# Patient Record
Sex: Female | Born: 1980 | Hispanic: No | Marital: Single | Smoking: Never smoker
Health system: Southern US, Community
[De-identification: ages and names within clinical notes are randomized; demographics above are authoritative.]

## PROBLEM LIST (undated history)

## (undated) DIAGNOSIS — F419 Anxiety disorder, unspecified: Secondary | ICD-10-CM

## (undated) DIAGNOSIS — D649 Anemia, unspecified: Secondary | ICD-10-CM

## (undated) DIAGNOSIS — R7303 Prediabetes: Secondary | ICD-10-CM

## (undated) DIAGNOSIS — F32A Depression, unspecified: Secondary | ICD-10-CM

---
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
## 2008-12-01 ENCOUNTER — Other Ambulatory Visit: Admission: RE | Admit: 2008-12-01 | Discharge: 2008-12-01 | Payer: Self-pay | Admitting: Family Medicine

## 2011-05-15 ENCOUNTER — Other Ambulatory Visit (HOSPITAL_COMMUNITY)
Admission: RE | Admit: 2011-05-15 | Discharge: 2011-05-15 | Disposition: A | Discharge status: Home / Self Care | Payer: BC Managed Care – PPO | Source: Ambulatory Visit | Attending: Family Medicine | Admitting: Family Medicine

## 2011-05-15 DIAGNOSIS — Z124 Encounter for screening for malignant neoplasm of cervix: Secondary | ICD-10-CM | POA: Insufficient documentation

## 2015-12-28 DIAGNOSIS — F339 Major depressive disorder, recurrent, unspecified: Secondary | ICD-10-CM | POA: Diagnosis not present

## 2016-01-27 DIAGNOSIS — Z0000 Encounter for general adult medical examination without abnormal findings: Secondary | ICD-10-CM | POA: Diagnosis not present

## 2016-01-27 DIAGNOSIS — E559 Vitamin D deficiency, unspecified: Secondary | ICD-10-CM | POA: Diagnosis not present

## 2016-01-27 DIAGNOSIS — D509 Iron deficiency anemia, unspecified: Secondary | ICD-10-CM | POA: Diagnosis not present

## 2017-05-30 DIAGNOSIS — Z131 Encounter for screening for diabetes mellitus: Secondary | ICD-10-CM | POA: Diagnosis not present

## 2017-05-30 DIAGNOSIS — E559 Vitamin D deficiency, unspecified: Secondary | ICD-10-CM | POA: Diagnosis not present

## 2017-05-30 DIAGNOSIS — F419 Anxiety disorder, unspecified: Secondary | ICD-10-CM | POA: Diagnosis not present

## 2017-05-30 DIAGNOSIS — D509 Iron deficiency anemia, unspecified: Secondary | ICD-10-CM | POA: Diagnosis not present

## 2017-05-30 DIAGNOSIS — Z13220 Encounter for screening for lipoid disorders: Secondary | ICD-10-CM | POA: Diagnosis not present

## 2017-05-30 DIAGNOSIS — F339 Major depressive disorder, recurrent, unspecified: Secondary | ICD-10-CM | POA: Diagnosis not present

## 2017-09-10 DIAGNOSIS — E559 Vitamin D deficiency, unspecified: Secondary | ICD-10-CM | POA: Diagnosis not present

## 2017-09-10 DIAGNOSIS — R7303 Prediabetes: Secondary | ICD-10-CM | POA: Diagnosis not present

## 2017-09-10 DIAGNOSIS — Z0000 Encounter for general adult medical examination without abnormal findings: Secondary | ICD-10-CM | POA: Diagnosis not present

## 2017-09-10 DIAGNOSIS — Z23 Encounter for immunization: Secondary | ICD-10-CM | POA: Diagnosis not present

## 2017-09-10 DIAGNOSIS — D509 Iron deficiency anemia, unspecified: Secondary | ICD-10-CM | POA: Diagnosis not present

## 2017-09-20 ENCOUNTER — Other Ambulatory Visit: Payer: Self-pay | Admitting: Family Medicine

## 2017-09-20 ENCOUNTER — Other Ambulatory Visit (HOSPITAL_COMMUNITY)
Admission: RE | Admit: 2017-09-20 | Discharge: 2017-09-20 | Disposition: A | Discharge status: Home / Self Care | Payer: BLUE CROSS/BLUE SHIELD | Source: Ambulatory Visit | Attending: Family Medicine | Admitting: Family Medicine

## 2017-09-20 DIAGNOSIS — D509 Iron deficiency anemia, unspecified: Secondary | ICD-10-CM | POA: Diagnosis not present

## 2017-09-20 DIAGNOSIS — N898 Other specified noninflammatory disorders of vagina: Secondary | ICD-10-CM | POA: Diagnosis not present

## 2017-09-20 DIAGNOSIS — Z124 Encounter for screening for malignant neoplasm of cervix: Secondary | ICD-10-CM | POA: Diagnosis not present

## 2017-09-20 DIAGNOSIS — E559 Vitamin D deficiency, unspecified: Secondary | ICD-10-CM | POA: Diagnosis not present

## 2017-09-24 LAB — CYTOLOGY - PAP: Diagnosis: NEGATIVE

## 2018-06-13 DIAGNOSIS — L299 Pruritus, unspecified: Secondary | ICD-10-CM | POA: Diagnosis not present

## 2018-09-30 DIAGNOSIS — D509 Iron deficiency anemia, unspecified: Secondary | ICD-10-CM | POA: Diagnosis not present

## 2018-09-30 DIAGNOSIS — E559 Vitamin D deficiency, unspecified: Secondary | ICD-10-CM | POA: Diagnosis not present

## 2018-09-30 DIAGNOSIS — R7303 Prediabetes: Secondary | ICD-10-CM | POA: Diagnosis not present

## 2018-09-30 DIAGNOSIS — S6991XA Unspecified injury of right wrist, hand and finger(s), initial encounter: Secondary | ICD-10-CM | POA: Diagnosis not present

## 2018-09-30 DIAGNOSIS — Z13220 Encounter for screening for lipoid disorders: Secondary | ICD-10-CM | POA: Diagnosis not present

## 2018-09-30 DIAGNOSIS — Z23 Encounter for immunization: Secondary | ICD-10-CM | POA: Diagnosis not present

## 2018-09-30 DIAGNOSIS — Z0000 Encounter for general adult medical examination without abnormal findings: Secondary | ICD-10-CM | POA: Diagnosis not present

## 2018-10-01 ENCOUNTER — Other Ambulatory Visit: Payer: Self-pay | Admitting: Family Medicine

## 2018-10-02 ENCOUNTER — Other Ambulatory Visit: Payer: Self-pay | Admitting: Family Medicine

## 2018-10-02 DIAGNOSIS — N6320 Unspecified lump in the left breast, unspecified quadrant: Secondary | ICD-10-CM

## 2018-10-08 ENCOUNTER — Ambulatory Visit
Admission: RE | Admit: 2018-10-08 | Discharge: 2018-10-08 | Disposition: A | Discharge status: Home / Self Care | Payer: 59 | Source: Ambulatory Visit | Attending: Family Medicine | Admitting: Family Medicine

## 2018-10-08 ENCOUNTER — Other Ambulatory Visit: Payer: Self-pay | Admitting: Family Medicine

## 2018-10-08 ENCOUNTER — Ambulatory Visit
Admission: RE | Admit: 2018-10-08 | Discharge: 2018-10-08 | Disposition: A | Discharge status: Home / Self Care | Payer: BLUE CROSS/BLUE SHIELD | Source: Ambulatory Visit | Attending: Family Medicine | Admitting: Family Medicine

## 2018-10-08 ENCOUNTER — Other Ambulatory Visit: Payer: Self-pay

## 2018-10-08 DIAGNOSIS — N63 Unspecified lump in unspecified breast: Secondary | ICD-10-CM

## 2018-10-08 DIAGNOSIS — N6489 Other specified disorders of breast: Secondary | ICD-10-CM | POA: Diagnosis not present

## 2018-10-08 DIAGNOSIS — R928 Other abnormal and inconclusive findings on diagnostic imaging of breast: Secondary | ICD-10-CM | POA: Diagnosis not present

## 2018-10-30 DIAGNOSIS — Z862 Personal history of diseases of the blood and blood-forming organs and certain disorders involving the immune mechanism: Secondary | ICD-10-CM | POA: Diagnosis not present

## 2019-03-03 DIAGNOSIS — H6980 Other specified disorders of Eustachian tube, unspecified ear: Secondary | ICD-10-CM | POA: Diagnosis not present

## 2019-07-25 DIAGNOSIS — Z20828 Contact with and (suspected) exposure to other viral communicable diseases: Secondary | ICD-10-CM | POA: Diagnosis not present

## 2019-09-09 DIAGNOSIS — M5412 Radiculopathy, cervical region: Secondary | ICD-10-CM | POA: Diagnosis not present

## 2019-09-09 DIAGNOSIS — M50323 Other cervical disc degeneration at C6-C7 level: Secondary | ICD-10-CM | POA: Diagnosis not present

## 2019-09-09 DIAGNOSIS — M546 Pain in thoracic spine: Secondary | ICD-10-CM | POA: Diagnosis not present

## 2019-09-09 DIAGNOSIS — M4124 Other idiopathic scoliosis, thoracic region: Secondary | ICD-10-CM | POA: Diagnosis not present

## 2019-09-16 DIAGNOSIS — M50323 Other cervical disc degeneration at C6-C7 level: Secondary | ICD-10-CM | POA: Diagnosis not present

## 2019-09-16 DIAGNOSIS — M546 Pain in thoracic spine: Secondary | ICD-10-CM | POA: Diagnosis not present

## 2019-09-16 DIAGNOSIS — M4124 Other idiopathic scoliosis, thoracic region: Secondary | ICD-10-CM | POA: Diagnosis not present

## 2019-09-16 DIAGNOSIS — M5412 Radiculopathy, cervical region: Secondary | ICD-10-CM | POA: Diagnosis not present

## 2019-09-17 DIAGNOSIS — M4124 Other idiopathic scoliosis, thoracic region: Secondary | ICD-10-CM | POA: Diagnosis not present

## 2019-09-17 DIAGNOSIS — M50323 Other cervical disc degeneration at C6-C7 level: Secondary | ICD-10-CM | POA: Diagnosis not present

## 2019-09-17 DIAGNOSIS — M5412 Radiculopathy, cervical region: Secondary | ICD-10-CM | POA: Diagnosis not present

## 2019-09-17 DIAGNOSIS — M546 Pain in thoracic spine: Secondary | ICD-10-CM | POA: Diagnosis not present

## 2019-09-21 DIAGNOSIS — M5412 Radiculopathy, cervical region: Secondary | ICD-10-CM | POA: Diagnosis not present

## 2019-09-21 DIAGNOSIS — M546 Pain in thoracic spine: Secondary | ICD-10-CM | POA: Diagnosis not present

## 2019-09-21 DIAGNOSIS — M4124 Other idiopathic scoliosis, thoracic region: Secondary | ICD-10-CM | POA: Diagnosis not present

## 2019-09-21 DIAGNOSIS — M50323 Other cervical disc degeneration at C6-C7 level: Secondary | ICD-10-CM | POA: Diagnosis not present

## 2019-09-22 DIAGNOSIS — M50323 Other cervical disc degeneration at C6-C7 level: Secondary | ICD-10-CM | POA: Diagnosis not present

## 2019-09-22 DIAGNOSIS — M5412 Radiculopathy, cervical region: Secondary | ICD-10-CM | POA: Diagnosis not present

## 2019-09-22 DIAGNOSIS — M4124 Other idiopathic scoliosis, thoracic region: Secondary | ICD-10-CM | POA: Diagnosis not present

## 2019-09-22 DIAGNOSIS — M546 Pain in thoracic spine: Secondary | ICD-10-CM | POA: Diagnosis not present

## 2019-09-24 DIAGNOSIS — M5412 Radiculopathy, cervical region: Secondary | ICD-10-CM | POA: Diagnosis not present

## 2019-09-24 DIAGNOSIS — M50323 Other cervical disc degeneration at C6-C7 level: Secondary | ICD-10-CM | POA: Diagnosis not present

## 2019-09-24 DIAGNOSIS — M546 Pain in thoracic spine: Secondary | ICD-10-CM | POA: Diagnosis not present

## 2019-09-24 DIAGNOSIS — M4124 Other idiopathic scoliosis, thoracic region: Secondary | ICD-10-CM | POA: Diagnosis not present

## 2019-09-28 DIAGNOSIS — M5412 Radiculopathy, cervical region: Secondary | ICD-10-CM | POA: Diagnosis not present

## 2019-09-28 DIAGNOSIS — M50323 Other cervical disc degeneration at C6-C7 level: Secondary | ICD-10-CM | POA: Diagnosis not present

## 2019-09-28 DIAGNOSIS — M546 Pain in thoracic spine: Secondary | ICD-10-CM | POA: Diagnosis not present

## 2019-09-28 DIAGNOSIS — M4124 Other idiopathic scoliosis, thoracic region: Secondary | ICD-10-CM | POA: Diagnosis not present

## 2019-09-30 DIAGNOSIS — M5412 Radiculopathy, cervical region: Secondary | ICD-10-CM | POA: Diagnosis not present

## 2019-09-30 DIAGNOSIS — M4124 Other idiopathic scoliosis, thoracic region: Secondary | ICD-10-CM | POA: Diagnosis not present

## 2019-09-30 DIAGNOSIS — M546 Pain in thoracic spine: Secondary | ICD-10-CM | POA: Diagnosis not present

## 2019-09-30 DIAGNOSIS — M50323 Other cervical disc degeneration at C6-C7 level: Secondary | ICD-10-CM | POA: Diagnosis not present

## 2019-10-01 DIAGNOSIS — M5412 Radiculopathy, cervical region: Secondary | ICD-10-CM | POA: Diagnosis not present

## 2019-10-01 DIAGNOSIS — M50323 Other cervical disc degeneration at C6-C7 level: Secondary | ICD-10-CM | POA: Diagnosis not present

## 2019-10-01 DIAGNOSIS — M4124 Other idiopathic scoliosis, thoracic region: Secondary | ICD-10-CM | POA: Diagnosis not present

## 2019-10-01 DIAGNOSIS — M546 Pain in thoracic spine: Secondary | ICD-10-CM | POA: Diagnosis not present

## 2019-10-06 DIAGNOSIS — M546 Pain in thoracic spine: Secondary | ICD-10-CM | POA: Diagnosis not present

## 2019-10-06 DIAGNOSIS — M50323 Other cervical disc degeneration at C6-C7 level: Secondary | ICD-10-CM | POA: Diagnosis not present

## 2019-10-06 DIAGNOSIS — M5412 Radiculopathy, cervical region: Secondary | ICD-10-CM | POA: Diagnosis not present

## 2019-10-06 DIAGNOSIS — M4124 Other idiopathic scoliosis, thoracic region: Secondary | ICD-10-CM | POA: Diagnosis not present

## 2019-10-07 DIAGNOSIS — M546 Pain in thoracic spine: Secondary | ICD-10-CM | POA: Diagnosis not present

## 2019-10-07 DIAGNOSIS — M4124 Other idiopathic scoliosis, thoracic region: Secondary | ICD-10-CM | POA: Diagnosis not present

## 2019-10-07 DIAGNOSIS — M5412 Radiculopathy, cervical region: Secondary | ICD-10-CM | POA: Diagnosis not present

## 2019-10-07 DIAGNOSIS — M50323 Other cervical disc degeneration at C6-C7 level: Secondary | ICD-10-CM | POA: Diagnosis not present

## 2019-10-12 DIAGNOSIS — M542 Cervicalgia: Secondary | ICD-10-CM | POA: Diagnosis not present

## 2019-10-12 DIAGNOSIS — M5412 Radiculopathy, cervical region: Secondary | ICD-10-CM | POA: Diagnosis not present

## 2019-10-12 DIAGNOSIS — Z6826 Body mass index (BMI) 26.0-26.9, adult: Secondary | ICD-10-CM | POA: Diagnosis not present

## 2019-10-26 DIAGNOSIS — M5412 Radiculopathy, cervical region: Secondary | ICD-10-CM | POA: Diagnosis not present

## 2019-10-26 DIAGNOSIS — M542 Cervicalgia: Secondary | ICD-10-CM | POA: Diagnosis not present

## 2019-10-30 DIAGNOSIS — M6281 Muscle weakness (generalized): Secondary | ICD-10-CM | POA: Diagnosis not present

## 2019-10-30 DIAGNOSIS — M5412 Radiculopathy, cervical region: Secondary | ICD-10-CM | POA: Diagnosis not present

## 2019-10-30 DIAGNOSIS — M546 Pain in thoracic spine: Secondary | ICD-10-CM | POA: Diagnosis not present

## 2019-10-30 DIAGNOSIS — M542 Cervicalgia: Secondary | ICD-10-CM | POA: Diagnosis not present

## 2019-11-03 DIAGNOSIS — M5412 Radiculopathy, cervical region: Secondary | ICD-10-CM | POA: Diagnosis not present

## 2019-11-03 DIAGNOSIS — M546 Pain in thoracic spine: Secondary | ICD-10-CM | POA: Diagnosis not present

## 2019-11-03 DIAGNOSIS — M6281 Muscle weakness (generalized): Secondary | ICD-10-CM | POA: Diagnosis not present

## 2019-11-03 DIAGNOSIS — M542 Cervicalgia: Secondary | ICD-10-CM | POA: Diagnosis not present

## 2019-11-05 DIAGNOSIS — M546 Pain in thoracic spine: Secondary | ICD-10-CM | POA: Diagnosis not present

## 2019-11-05 DIAGNOSIS — M542 Cervicalgia: Secondary | ICD-10-CM | POA: Diagnosis not present

## 2019-11-05 DIAGNOSIS — M5412 Radiculopathy, cervical region: Secondary | ICD-10-CM | POA: Diagnosis not present

## 2019-11-05 DIAGNOSIS — M6281 Muscle weakness (generalized): Secondary | ICD-10-CM | POA: Diagnosis not present

## 2019-11-10 DIAGNOSIS — M546 Pain in thoracic spine: Secondary | ICD-10-CM | POA: Diagnosis not present

## 2019-11-10 DIAGNOSIS — M542 Cervicalgia: Secondary | ICD-10-CM | POA: Diagnosis not present

## 2019-11-10 DIAGNOSIS — M5412 Radiculopathy, cervical region: Secondary | ICD-10-CM | POA: Diagnosis not present

## 2019-11-10 DIAGNOSIS — M6281 Muscle weakness (generalized): Secondary | ICD-10-CM | POA: Diagnosis not present

## 2019-11-12 DIAGNOSIS — M546 Pain in thoracic spine: Secondary | ICD-10-CM | POA: Diagnosis not present

## 2019-11-12 DIAGNOSIS — M6281 Muscle weakness (generalized): Secondary | ICD-10-CM | POA: Diagnosis not present

## 2019-11-12 DIAGNOSIS — M5412 Radiculopathy, cervical region: Secondary | ICD-10-CM | POA: Diagnosis not present

## 2019-11-12 DIAGNOSIS — M542 Cervicalgia: Secondary | ICD-10-CM | POA: Diagnosis not present

## 2019-11-17 DIAGNOSIS — M542 Cervicalgia: Secondary | ICD-10-CM | POA: Diagnosis not present

## 2019-11-17 DIAGNOSIS — M5412 Radiculopathy, cervical region: Secondary | ICD-10-CM | POA: Diagnosis not present

## 2019-11-17 DIAGNOSIS — M6281 Muscle weakness (generalized): Secondary | ICD-10-CM | POA: Diagnosis not present

## 2019-11-17 DIAGNOSIS — M546 Pain in thoracic spine: Secondary | ICD-10-CM | POA: Diagnosis not present

## 2019-11-19 DIAGNOSIS — M5412 Radiculopathy, cervical region: Secondary | ICD-10-CM | POA: Diagnosis not present

## 2019-11-19 DIAGNOSIS — M546 Pain in thoracic spine: Secondary | ICD-10-CM | POA: Diagnosis not present

## 2019-11-19 DIAGNOSIS — M542 Cervicalgia: Secondary | ICD-10-CM | POA: Diagnosis not present

## 2019-11-19 DIAGNOSIS — M6281 Muscle weakness (generalized): Secondary | ICD-10-CM | POA: Diagnosis not present

## 2019-11-24 DIAGNOSIS — M546 Pain in thoracic spine: Secondary | ICD-10-CM | POA: Diagnosis not present

## 2019-11-24 DIAGNOSIS — M5412 Radiculopathy, cervical region: Secondary | ICD-10-CM | POA: Diagnosis not present

## 2019-11-24 DIAGNOSIS — M542 Cervicalgia: Secondary | ICD-10-CM | POA: Diagnosis not present

## 2019-11-24 DIAGNOSIS — M6281 Muscle weakness (generalized): Secondary | ICD-10-CM | POA: Diagnosis not present

## 2019-11-26 DIAGNOSIS — M5412 Radiculopathy, cervical region: Secondary | ICD-10-CM | POA: Diagnosis not present

## 2019-11-26 DIAGNOSIS — M6281 Muscle weakness (generalized): Secondary | ICD-10-CM | POA: Diagnosis not present

## 2019-11-26 DIAGNOSIS — M546 Pain in thoracic spine: Secondary | ICD-10-CM | POA: Diagnosis not present

## 2019-11-26 DIAGNOSIS — M542 Cervicalgia: Secondary | ICD-10-CM | POA: Diagnosis not present

## 2019-12-01 DIAGNOSIS — M546 Pain in thoracic spine: Secondary | ICD-10-CM | POA: Diagnosis not present

## 2019-12-01 DIAGNOSIS — M6281 Muscle weakness (generalized): Secondary | ICD-10-CM | POA: Diagnosis not present

## 2019-12-01 DIAGNOSIS — M5412 Radiculopathy, cervical region: Secondary | ICD-10-CM | POA: Diagnosis not present

## 2019-12-01 DIAGNOSIS — M542 Cervicalgia: Secondary | ICD-10-CM | POA: Diagnosis not present

## 2019-12-03 DIAGNOSIS — M542 Cervicalgia: Secondary | ICD-10-CM | POA: Diagnosis not present

## 2019-12-03 DIAGNOSIS — M6281 Muscle weakness (generalized): Secondary | ICD-10-CM | POA: Diagnosis not present

## 2019-12-03 DIAGNOSIS — Z23 Encounter for immunization: Secondary | ICD-10-CM | POA: Diagnosis not present

## 2019-12-03 DIAGNOSIS — M5412 Radiculopathy, cervical region: Secondary | ICD-10-CM | POA: Diagnosis not present

## 2019-12-03 DIAGNOSIS — M546 Pain in thoracic spine: Secondary | ICD-10-CM | POA: Diagnosis not present

## 2019-12-08 DIAGNOSIS — M5412 Radiculopathy, cervical region: Secondary | ICD-10-CM | POA: Diagnosis not present

## 2019-12-08 DIAGNOSIS — M6281 Muscle weakness (generalized): Secondary | ICD-10-CM | POA: Diagnosis not present

## 2019-12-08 DIAGNOSIS — M546 Pain in thoracic spine: Secondary | ICD-10-CM | POA: Diagnosis not present

## 2019-12-08 DIAGNOSIS — M542 Cervicalgia: Secondary | ICD-10-CM | POA: Diagnosis not present

## 2019-12-08 DIAGNOSIS — Z6826 Body mass index (BMI) 26.0-26.9, adult: Secondary | ICD-10-CM | POA: Diagnosis not present

## 2019-12-10 DIAGNOSIS — M546 Pain in thoracic spine: Secondary | ICD-10-CM | POA: Diagnosis not present

## 2019-12-10 DIAGNOSIS — M6281 Muscle weakness (generalized): Secondary | ICD-10-CM | POA: Diagnosis not present

## 2019-12-10 DIAGNOSIS — M5412 Radiculopathy, cervical region: Secondary | ICD-10-CM | POA: Diagnosis not present

## 2019-12-10 DIAGNOSIS — M542 Cervicalgia: Secondary | ICD-10-CM | POA: Diagnosis not present

## 2019-12-15 DIAGNOSIS — M6281 Muscle weakness (generalized): Secondary | ICD-10-CM | POA: Diagnosis not present

## 2019-12-15 DIAGNOSIS — M546 Pain in thoracic spine: Secondary | ICD-10-CM | POA: Diagnosis not present

## 2019-12-15 DIAGNOSIS — M5412 Radiculopathy, cervical region: Secondary | ICD-10-CM | POA: Diagnosis not present

## 2019-12-15 DIAGNOSIS — M542 Cervicalgia: Secondary | ICD-10-CM | POA: Diagnosis not present

## 2019-12-18 DIAGNOSIS — M546 Pain in thoracic spine: Secondary | ICD-10-CM | POA: Diagnosis not present

## 2019-12-18 DIAGNOSIS — M6281 Muscle weakness (generalized): Secondary | ICD-10-CM | POA: Diagnosis not present

## 2019-12-18 DIAGNOSIS — M5412 Radiculopathy, cervical region: Secondary | ICD-10-CM | POA: Diagnosis not present

## 2019-12-18 DIAGNOSIS — M542 Cervicalgia: Secondary | ICD-10-CM | POA: Diagnosis not present

## 2019-12-24 DIAGNOSIS — M542 Cervicalgia: Secondary | ICD-10-CM | POA: Diagnosis not present

## 2019-12-24 DIAGNOSIS — M546 Pain in thoracic spine: Secondary | ICD-10-CM | POA: Diagnosis not present

## 2019-12-24 DIAGNOSIS — M5412 Radiculopathy, cervical region: Secondary | ICD-10-CM | POA: Diagnosis not present

## 2019-12-24 DIAGNOSIS — M6281 Muscle weakness (generalized): Secondary | ICD-10-CM | POA: Diagnosis not present

## 2019-12-25 DIAGNOSIS — Z23 Encounter for immunization: Secondary | ICD-10-CM | POA: Diagnosis not present

## 2019-12-29 DIAGNOSIS — M5412 Radiculopathy, cervical region: Secondary | ICD-10-CM | POA: Diagnosis not present

## 2019-12-29 DIAGNOSIS — M546 Pain in thoracic spine: Secondary | ICD-10-CM | POA: Diagnosis not present

## 2019-12-29 DIAGNOSIS — M542 Cervicalgia: Secondary | ICD-10-CM | POA: Diagnosis not present

## 2019-12-29 DIAGNOSIS — M6281 Muscle weakness (generalized): Secondary | ICD-10-CM | POA: Diagnosis not present

## 2020-01-12 DIAGNOSIS — M6281 Muscle weakness (generalized): Secondary | ICD-10-CM | POA: Diagnosis not present

## 2020-01-12 DIAGNOSIS — M542 Cervicalgia: Secondary | ICD-10-CM | POA: Diagnosis not present

## 2020-01-12 DIAGNOSIS — M546 Pain in thoracic spine: Secondary | ICD-10-CM | POA: Diagnosis not present

## 2020-01-12 DIAGNOSIS — M5412 Radiculopathy, cervical region: Secondary | ICD-10-CM | POA: Diagnosis not present

## 2020-01-26 DIAGNOSIS — M6281 Muscle weakness (generalized): Secondary | ICD-10-CM | POA: Diagnosis not present

## 2020-01-26 DIAGNOSIS — M5412 Radiculopathy, cervical region: Secondary | ICD-10-CM | POA: Diagnosis not present

## 2020-01-26 DIAGNOSIS — M546 Pain in thoracic spine: Secondary | ICD-10-CM | POA: Diagnosis not present

## 2020-01-26 DIAGNOSIS — M542 Cervicalgia: Secondary | ICD-10-CM | POA: Diagnosis not present

## 2020-02-16 DIAGNOSIS — M6281 Muscle weakness (generalized): Secondary | ICD-10-CM | POA: Diagnosis not present

## 2020-02-16 DIAGNOSIS — M5412 Radiculopathy, cervical region: Secondary | ICD-10-CM | POA: Diagnosis not present

## 2020-02-16 DIAGNOSIS — M546 Pain in thoracic spine: Secondary | ICD-10-CM | POA: Diagnosis not present

## 2020-02-16 DIAGNOSIS — M542 Cervicalgia: Secondary | ICD-10-CM | POA: Diagnosis not present

## 2020-03-08 DIAGNOSIS — M5412 Radiculopathy, cervical region: Secondary | ICD-10-CM | POA: Diagnosis not present

## 2020-03-08 DIAGNOSIS — Z6826 Body mass index (BMI) 26.0-26.9, adult: Secondary | ICD-10-CM | POA: Diagnosis not present

## 2020-03-08 DIAGNOSIS — M542 Cervicalgia: Secondary | ICD-10-CM | POA: Diagnosis not present

## 2020-04-27 ENCOUNTER — Other Ambulatory Visit: Payer: Self-pay | Admitting: Radiology

## 2020-04-27 ENCOUNTER — Other Ambulatory Visit: Payer: Self-pay

## 2020-04-27 DIAGNOSIS — Z20822 Contact with and (suspected) exposure to covid-19: Secondary | ICD-10-CM

## 2020-04-28 LAB — NOVEL CORONAVIRUS, NAA: SARS-CoV-2, NAA: NOT DETECTED

## 2020-04-28 LAB — SARS-COV-2, NAA 2 DAY TAT

## 2020-05-17 DIAGNOSIS — Z20822 Contact with and (suspected) exposure to covid-19: Secondary | ICD-10-CM | POA: Diagnosis not present

## 2020-05-31 DIAGNOSIS — Z20822 Contact with and (suspected) exposure to covid-19: Secondary | ICD-10-CM | POA: Diagnosis not present

## 2020-08-29 DIAGNOSIS — Z20822 Contact with and (suspected) exposure to covid-19: Secondary | ICD-10-CM | POA: Diagnosis not present

## 2020-11-14 DIAGNOSIS — D509 Iron deficiency anemia, unspecified: Secondary | ICD-10-CM | POA: Diagnosis not present

## 2020-11-14 DIAGNOSIS — R7303 Prediabetes: Secondary | ICD-10-CM | POA: Diagnosis not present

## 2020-11-14 DIAGNOSIS — Z0000 Encounter for general adult medical examination without abnormal findings: Secondary | ICD-10-CM | POA: Diagnosis not present

## 2020-11-14 DIAGNOSIS — F3341 Major depressive disorder, recurrent, in partial remission: Secondary | ICD-10-CM | POA: Diagnosis not present

## 2020-11-14 DIAGNOSIS — E559 Vitamin D deficiency, unspecified: Secondary | ICD-10-CM | POA: Diagnosis not present

## 2020-11-15 DIAGNOSIS — M5412 Radiculopathy, cervical region: Secondary | ICD-10-CM | POA: Diagnosis not present

## 2020-11-15 DIAGNOSIS — Z6826 Body mass index (BMI) 26.0-26.9, adult: Secondary | ICD-10-CM | POA: Diagnosis not present

## 2020-11-15 DIAGNOSIS — M542 Cervicalgia: Secondary | ICD-10-CM | POA: Diagnosis not present

## 2020-12-26 DIAGNOSIS — Z124 Encounter for screening for malignant neoplasm of cervix: Secondary | ICD-10-CM | POA: Diagnosis not present

## 2020-12-26 DIAGNOSIS — F3341 Major depressive disorder, recurrent, in partial remission: Secondary | ICD-10-CM | POA: Diagnosis not present

## 2021-05-17 DIAGNOSIS — M542 Cervicalgia: Secondary | ICD-10-CM | POA: Diagnosis not present

## 2021-05-17 DIAGNOSIS — M5412 Radiculopathy, cervical region: Secondary | ICD-10-CM | POA: Diagnosis not present

## 2021-09-26 ENCOUNTER — Encounter: Payer: Self-pay | Admitting: Orthopaedic Surgery

## 2021-09-26 ENCOUNTER — Ambulatory Visit (INDEPENDENT_AMBULATORY_CARE_PROVIDER_SITE_OTHER): Payer: 59 | Admitting: Orthopaedic Surgery

## 2021-09-26 ENCOUNTER — Ambulatory Visit (INDEPENDENT_AMBULATORY_CARE_PROVIDER_SITE_OTHER): Payer: 59

## 2021-09-26 ENCOUNTER — Other Ambulatory Visit: Payer: Self-pay

## 2021-09-26 VITALS — BP 101/69 | Ht 66.0 in | Wt 155.0 lb

## 2021-09-26 DIAGNOSIS — M542 Cervicalgia: Secondary | ICD-10-CM

## 2021-09-26 DIAGNOSIS — G8929 Other chronic pain: Secondary | ICD-10-CM

## 2021-09-26 DIAGNOSIS — M25511 Pain in right shoulder: Secondary | ICD-10-CM

## 2021-09-26 NOTE — Progress Notes (Signed)
Office Visit Note   Patient: Cheryl Juarez           Date of Birth: Jun 06, 1981           MRN: [id number] Visit Date: 09/26/2021              Requested by: No referring provider defined for this encounter. PCP: No primary care provider on file.   Assessment & Plan: Visit Diagnoses:  1. Neck pain   2. Chronic right shoulder pain   3. Cervicalgia     Plan: Patient significant symptoms for greater than 2 years not responsive to gabapentin chiropractic treatments anti-inflammatories.  She has been treated at [facility] for greater than 6 months.  Would recommend proceeding with cervical MRI scan for persistent neck and right arm radicular symptoms C6 distribution.  Follow-Up Instructions: No follow-ups on file.   Orders:  Orders Placed This Encounter  Procedures   XR Cervical Spine 2 or 3 views   XR Shoulder Right   MR Cervical Spine w/o contrast   No orders of the defined types were placed in this encounter.     Procedures: No procedures performed   Clinical Data: No additional findings.   Subjective: Chief Complaint  Patient presents with   Neck - Pain   Right Shoulder - Pain    HPI 41 year old female with neck pain right shoulder pain and pain between her shoulder blades x2 years.  She states she picked up a bookshelf fell within the buttocks about 2 years ago and has had some persistent symptoms since then.  She has been seen by chiropractor.  She has seen a spine scoliosis and has been on gabapentin which she states is increased the dose just makes her sleepy.  Pain radiates down her arm down into her hand primarily index finger.  She denies headaches.  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  No symptoms on the left upper extremity.  She is right-hand dominant.  She types.  Patient concerned about long-term Neurontin with the other medication she takes for depression.  Review of Systems positive for  depression on medication.  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  All other systems noncontributory to HPI.   Objective: Vital Signs: BP 101/69    Ht 5\' 6"  (1.676 m)    Wt 155 lb (70.3 kg)    BMI 25.02 kg/m   Physical Exam Constitutional:      Appearance: She is well-developed.  HENT:     Head: Normocephalic.     Right Ear: External ear normal.     Left Ear: External ear normal. There is no impacted cerumen.  Eyes:     Pupils: Pupils are equal, round, and reactive to light.  Neck:     Thyroid: No thyromegaly.     Trachea: No tracheal deviation.  Cardiovascular:     Rate and Rhythm: Normal rate.  Pulmonary:     Effort: Pulmonary effort is normal.  Abdominal:     Palpations: Abdomen is soft.  Musculoskeletal:     Cervical back: No rigidity.  Skin:    General: Skin is warm and dry.  Neurological:     Mental Status: She is alert and oriented to person, place, and time.  Psychiatric:        Behavior: Behavior normal.    Ortho Exam healed anterior neck thyroid incision.  Some brachial plexus tenderness on the right negative  on the left negative impingement the shoulder.  Upper extremity reflexes are 2+ and symmetrical.  Some decreased sensation index finger negative carpal compression.  No atrophy upper extremities no rash of exposed skin no lower extremity clonus normal heel toe gait.  Specialty Comments:  No specialty comments available.  Imaging: XR Cervical Spine 2 or 3 views  Result Date: 09/26/2021 AP lateral cervical spine images were obtained and reviewed.  This shows some metal clips from previous thyroid surgery.  Slight cervical curvature on AP.  Mild narrowing C5-6 C6-7 disc space with some straightening of the cervical spine on the lateral radiograph. Impression: Straightening of the cervical spine nonspecific postop thyroid surgery metal clips.  XR Shoulder Right  Result Date: 09/26/2021 3 views right shoulder obtained and reviewed this shows  normal right shoulder.  No acute or chronic changes. Impression: Normal right shoulder radiographs.    PMFS History: There are no problems to display for this patient.  No past medical history on file.  Family History  Problem Relation Age of Onset   Breast cancer Paternal Aunt    Breast cancer Paternal Grandmother     No past surgical history on file. Social History   Occupational History   Not on file  Tobacco Use   Smoking status: Never   Smokeless tobacco: Never  Substance and Sexual Activity   Alcohol use: Yes    Comment: once monthly   Drug use: Not on file   Sexual activity: Not on file

## 2021-09-29 ENCOUNTER — Telehealth: Payer: Self-pay | Admitting: Orthopaedic Surgery

## 2021-09-29 NOTE — Telephone Encounter (Signed)
Pt asking for a refill on Gabapentin if possible. The best pharmacy is [id number] - [place], [place] - [address] and the best call back number is [phone].

## 2021-10-02 MED ORDER — GABAPENTIN 300 MG PO CAPS: 600.0000 mg | ORAL_CAPSULE | Freq: Three times a day (TID) | ORAL | 0 refills | Status: DC | PRN

## 2021-10-02 NOTE — Telephone Encounter (Signed)
Are you able to prescribe for patient? From the meds tab, she is taking Gabapentin 600mg  three times daily. You have ordered MRI Cervical Spine. She has been treated by [facility], however, was recently a new patient for you.

## 2021-10-02 NOTE — Telephone Encounter (Signed)
Sent to pharmacy. I left voicemail for patient advising. 

## 2021-10-05 ENCOUNTER — Other Ambulatory Visit: Payer: Self-pay

## 2021-10-05 ENCOUNTER — Ambulatory Visit (HOSPITAL_COMMUNITY)
Admission: RE | Admit: 2021-10-05 | Discharge: 2021-10-05 | Disposition: A | Discharge status: Home / Self Care | Payer: 59 | Source: Ambulatory Visit | Attending: Orthopaedic Surgery | Admitting: Orthopaedic Surgery

## 2021-10-05 DIAGNOSIS — M542 Cervicalgia: Secondary | ICD-10-CM | POA: Diagnosis present

## 2021-10-11 ENCOUNTER — Encounter: Payer: Self-pay | Admitting: Orthopaedic Surgery

## 2021-10-11 ENCOUNTER — Other Ambulatory Visit: Payer: Self-pay

## 2021-10-11 ENCOUNTER — Ambulatory Visit (INDEPENDENT_AMBULATORY_CARE_PROVIDER_SITE_OTHER): Payer: 59 | Admitting: Orthopaedic Surgery

## 2021-10-11 DIAGNOSIS — M5020 Other cervical disc displacement, unspecified cervical region: Secondary | ICD-10-CM | POA: Diagnosis not present

## 2021-10-11 NOTE — Progress Notes (Signed)
Office Visit Note   Patient: Cheryl Juarez           Date of Birth: [birth year]           MRN: [id number] Visit Date: 10/11/2021              Requested by: No referring provider defined for this encounter. 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  No compression on the opposite left side.  Long discussion on single level disc extrusion with compression and radiculopathy.  She has had symptoms for 2 years has been through chiropractic treatments, anti-inflammatories, IM steroid injections, prednisone Dosepaks in the past with persistent symptoms.  She states she has persistent symptoms without sustained relief.  We discussed options including single level cervical fusion.  Percent been present for 2 years unlikely she gets sustained relief with epidural steroid injection.  Risk surgery discussed including dysphagia dysphonia.  Questions were elicited and answered she understands request to proceed.  Follow-Up Instructions: No follow-ups on file.   Orders:  No orders of the defined types were placed in this encounter.  No orders of the defined types were placed in this encounter.     Procedures: No procedures performed   Clinical Data: No additional findings.   Subjective: Chief Complaint  Patient presents with   Neck - Pain, Follow-up    MRI cervical spine review    HPI 41 year old female returns with ongoing problems with neck pain that radiates in the right shoulder present x2 years.  She states she originally had picked up a bookshelf about 2 years ago with acute injury with persistent symptoms since that time.  She has been through chiropractic treatment, IM steroid injection which gave her temporary relief.  Prednisone Dosepak gave her some relief while she was taking the medication.  Patient's used some  Neurontin.  She continues to have neck pain that radiates into her right arm with numbness tingling weakness.  She has trouble sleeping at times.  Patient has noticed weakness on her arm with activity on the right no problems with the left.  Review of Systems 14 point update unchanged from 09/22/2021.  Previous thyroid surgery 18 years ago.  Positive for depression all systems noncontributory to HPI.   Objective: Vital Signs: BP 103/65    Pulse 69    Ht 5\' 6"  (1.676 m)    Wt 155 lb (70.3 kg)    BMI 25.02 kg/m   Physical Exam Constitutional:      Appearance: She is well-developed.  HENT:     Head: Normocephalic.     Right Ear: External ear normal.     Left Ear: External ear normal. There is no impacted cerumen.  Eyes:     Pupils: Pupils are equal, round, and reactive to light.  Neck:     Thyroid: No thyromegaly.     Trachea: No tracheal deviation.  Cardiovascular:     Rate and Rhythm: Normal rate.  Pulmonary:     Effort: Pulmonary effort is normal.  Abdominal:     Palpations: Abdomen is soft.  Musculoskeletal:     Cervical back: No rigidity.  Skin:    General: Skin is warm and dry.  Neurological:     Mental Status: She is alert and oriented to person,  place, and time.  Psychiatric:        Behavior: Behavior normal.    Ortho Exam patient has brachial plexus tenderness on the right negative on the left positive Spurling on the right.  Biceps triceps brachial radialis is intact.  Increased pain with cervical compression no relief with distraction.  No atrophy of the upper extremities.  No lower extremity clonus negative Babinski.  She is able to ambulate normal heel toe gait.  Specialty Comments:  No specialty comments available.  Imaging: Narrative & Impression  CLINICAL DATA:  Chronic neck pain. Right arm pain C6 distribution.   EXAM: MRI CERVICAL SPINE WITHOUT CONTRAST   TECHNIQUE: Multiplanar, multisequence MR imaging of the cervical spine was performed. No  intravenous contrast was administered.   COMPARISON:  Cervical spine plain films 09/26/2021.   FINDINGS: Alignment: No significant listhesis is present. Straightening of the normal cervical lordosis is again seen. Mild rightward curvature is centered at C4-5.   Vertebrae: Marrow signal and vertebral body heights are normal.   Cord: Normal signal and morphology.   Posterior Fossa, vertebral arteries, paraspinal tissues: Craniocervical junction is normal. Flow is present in the vertebral arteries bilaterally. Visualized intracranial contents are normal.   Disc levels:   C2-3: Asymmetric left-sided facet hypertrophy is present without significant stenosis.   C3-4: Negative.   C4-5: Asymmetric left-sided uncovertebral spurring is present without significant stenosis.   C5-6: Negative.   C6-7: A rightward soft disc extrusion results in moderate medial right foraminal narrowing and potentially contacts the right lateral aspect of the cord. The left foramen is patent.   C7-T1: Negative.   IMPRESSION: 1. Rightward soft disc extrusion at C6-7 potentially contacts the right lateral aspect of the cord and contributes to moderate medial right foraminal stenosis. 2. Asymmetric left-sided facet hypertrophy at C2-3 and C4-5 without significant stenosis at these levels.     Electronically Signed   By: Marin Roberts M.D.   On: 10/07/2021 11:35     PMFS History: Patient Active Problem List   Diagnosis Date Noted   Cervical disc herniation 10/11/2021   No past medical history on file.  Family History  Problem Relation Age of Onset   Breast cancer Paternal Aunt    Breast cancer Paternal Grandmother     No past surgical history on file. Social History   Occupational History   Not on file  Tobacco Use   Smoking status: Never   Smokeless tobacco: Never  Substance and Sexual Activity   Alcohol use: Yes    Comment: once monthly   Drug use: Not on file   Sexual  activity: Not on file

## 2021-10-12 ENCOUNTER — Telehealth: Payer: Self-pay | Admitting: Orthopaedic Surgery

## 2021-10-12 NOTE — Telephone Encounter (Signed)
Patient called. Requesting a call back from Dr. Ophelia Charter. Her call back number is [phone]

## 2021-10-12 NOTE — Telephone Encounter (Signed)
Please advise 

## 2021-10-16 ENCOUNTER — Other Ambulatory Visit: Payer: Self-pay

## 2021-10-20 ENCOUNTER — Other Ambulatory Visit: Payer: Self-pay

## 2021-10-28 ENCOUNTER — Other Ambulatory Visit: Payer: Self-pay | Admitting: Orthopaedic Surgery

## 2021-11-02 ENCOUNTER — Encounter: Payer: Self-pay | Admitting: Surgery

## 2021-11-02 ENCOUNTER — Other Ambulatory Visit: Payer: Self-pay

## 2021-11-02 ENCOUNTER — Ambulatory Visit (INDEPENDENT_AMBULATORY_CARE_PROVIDER_SITE_OTHER): Payer: 59 | Admitting: Surgery

## 2021-11-02 VITALS — BP 102/66 | HR 80 | Ht 66.0 in | Wt 155.0 lb

## 2021-11-02 DIAGNOSIS — M5020 Other cervical disc displacement, unspecified cervical region: Secondary | ICD-10-CM

## 2021-11-02 NOTE — Progress Notes (Signed)
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  States that neck pain and right upper extremity radiculopathy unchanged from previous visit.  She is wanting to proceed with C6-7 disc replacement surgery as scheduled.  Today history and physical performed.  Review of systems negative.  Surgical procedure discussed along potential rehab/recovery time.  All questions answered. ?

## 2021-11-09 NOTE — Pre-Procedure Instructions (Signed)
Surgical Instructions ? ? ? Your procedure is scheduled on Monday, November 13, 2021 at 7:30 AM. ? Report to [facility] Main Entrance "A" at 5:30 A.M., then check in with the Admitting office. ? Call this number if you have problems the morning of surgery: ? [phone] ? ? If you have any questions prior to your surgery date call [phone]: Open Monday-Friday 8am-4pm ? ? ? Remember: ? Do not eat after midnight the night before your surgery ? ?You may drink clear liquids until 4:30 the morning of your surgery.   ?Clear liquids allowed are: Water, Non-Citrus Juices (without pulp), Carbonated Beverages, Clear Tea, Black Coffee Only (NO MILK, CREAM OR POWDERED CREAMER of any kind), and Gatorade. ? ?Please complete your PRE-SURGERY ENSURE that was provided to you by 4:30 AM the morning of surgery.  Please, if able, drink it in one setting. DO NOT SIP. ? ?  ? Take these medicines the morning of surgery with A SIP OF WATER: ? ?buPROPion (WELLBUTRIN XL) ?FLUoxetine (PROZAC) ?gabapentin (NEURONTIN) - if needed ? ? ?As of today, STOP taking any Aspirin (unless otherwise instructed by your surgeon) Aleve, Naproxen, Ibuprofen, Motrin, Advil, Goody's, BC's, all herbal medications, fish oil, and all vitamins. ?         ?           ?Do NOT Smoke (Tobacco/Vaping) for 24 hours prior to your procedure. ? ?If you use a CPAP at night, you may bring your mask/headgear for your overnight stay. ?  ?Contacts, glasses, piercing's, hearing aid's, dentures or partials may not be worn into surgery, please bring cases for these belongings.  ?  ?For patients admitted to the hospital, discharge time will be determined by your treatment team. ?  ?Patients discharged the day of surgery will not be allowed to drive home, and someone needs to stay with them for 24 hours. ? ?NO VISITORS WILL BE ALLOWED IN PRE-OP WHERE PATIENTS ARE PREPPED FOR SURGERY.  ONLY 1 SUPPORT PERSON MAY BE PRESENT IN THE WAITING ROOM WHILE YOU ARE IN SURGERY.  IF YOU ARE TO  BE ADMITTED, ONCE YOU ARE IN YOUR ROOM YOU WILL BE ALLOWED TWO (2) VISITORS. (1) VISITOR MAY STAY OVERNIGHT BUT MUST ARRIVE TO THE ROOM BY 8pm.  Minor children may have two parents present. Special consideration for safety and communication needs will be reviewed on a case by case basis. ? ? ?Special instructions:   ?[facility]- Preparing For Surgery ? ?Before surgery, you can play an important role. Because skin is not sterile, your skin needs to be as free of germs as possible. You can reduce the number of germs on your skin by washing with CHG (chlorahexidine gluconate) Soap before surgery.  CHG is an antiseptic cleaner which kills germs and bonds with the skin to continue killing germs even after washing.   ? ?Oral Hygiene is also important to reduce your risk of infection.  Remember - BRUSH YOUR TEETH THE MORNING OF SURGERY WITH YOUR REGULAR TOOTHPASTE ? ?Please do not use if you have an allergy to CHG or antibacterial soaps. If your skin becomes reddened/irritated stop using the CHG.  ?Do not shave (including legs and underarms) for at least 48 hours prior to first CHG shower. It is OK to shave your face. ? ?Please follow these instructions carefully. ?  ?Shower the NIGHT BEFORE SURGERY and the MORNING OF SURGERY ? ?If you chose to wash your hair, wash your hair first as usual with your normal  shampoo. ? ?After you shampoo, rinse your hair and body thoroughly to remove the shampoo. ? ?Use CHG Soap as you would any other liquid soap. You can apply CHG directly to the skin and wash gently with a scrungie or a clean washcloth.  ? ?Apply the CHG Soap to your body ONLY FROM THE NECK DOWN.  Do not use on open wounds or open sores. Avoid contact with your eyes, ears, mouth and genitals (private parts). Wash Face and genitals (private parts)  with your normal soap.  ? ?Wash thoroughly, paying special attention to the area where your surgery will be performed. ? ?Thoroughly rinse your body with warm water from the  neck down. ? ?DO NOT shower/wash with your normal soap after using and rinsing off the CHG Soap. ? ?Pat yourself dry with a CLEAN TOWEL. ? ?Wear CLEAN PAJAMAS to bed the night before surgery ? ?Place CLEAN SHEETS on your bed the night before your surgery ? ?DO NOT SLEEP WITH PETS. ? ? ?Day of Surgery: ?Take a shower with CHG soap. ?Do not wear jewelry or makeup ?Do not wear lotions, powders, perfumes, or deodorant. ?Do not shave 48 hours prior to surgery.  ?Do not bring valuables to the hospital.  ?[facility] is not responsible for any belongings or valuables. ?Do not wear nail polish, gel polish, artificial nails, or any other type of covering on natural nails (fingers and toes) ?If you have artificial nails or gel coating that need to be removed by a nail salon, please have this removed prior to surgery. Artificial nails or gel coating may interfere with anesthesia's ability to adequately monitor your vital signs. ?Wear Clean/Comfortable clothing the morning of surgery ?Do not apply any deodorants/lotions.   ?Remember to brush your teeth WITH YOUR REGULAR TOOTHPASTE. ?  ?Please read over the following fact sheets that you were given. ? ? ?3 days prior to your procedure or After your COVID test ? ?? You are not required to quarantine however you are required to wear a well-fitting mask when you are out and around people not in your household. If your mask becomes wet or soiled, replace with a new one. ? ?? Wash your hands often with soap and water for 20 seconds or clean your hands with an alcohol-based hand sanitizer that contains at least 60% alcohol. ? ?? Do not share personal items. ? ?? Notify your provider: ? ?o if you are in close contact with someone who has COVID ? ?o or if you develop a fever of 100.4 or greater, sneezing, cough, sore throat, shortness of breath or body aches.  ? ?

## 2021-11-10 ENCOUNTER — Encounter (HOSPITAL_COMMUNITY): Payer: Self-pay

## 2021-11-10 ENCOUNTER — Other Ambulatory Visit: Payer: Self-pay

## 2021-11-10 ENCOUNTER — Encounter (HOSPITAL_COMMUNITY)
Admission: RE | Admit: 2021-11-10 | Discharge: 2021-11-10 | Disposition: A | Discharge status: Home / Self Care | Payer: 59 | Source: Ambulatory Visit | Attending: Orthopaedic Surgery | Admitting: Orthopaedic Surgery

## 2021-11-10 DIAGNOSIS — Z01818 Encounter for other preprocedural examination: Secondary | ICD-10-CM | POA: Diagnosis not present

## 2021-11-10 DIAGNOSIS — Z20822 Contact with and (suspected) exposure to covid-19: Secondary | ICD-10-CM | POA: Insufficient documentation

## 2021-11-10 HISTORY — DX: Anemia, unspecified: D64.9

## 2021-11-10 HISTORY — DX: Prediabetes: R73.03

## 2021-11-10 HISTORY — DX: Depression, unspecified: F32.A

## 2021-11-10 HISTORY — DX: Anxiety disorder, unspecified: F41.9

## 2021-11-10 LAB — CBC
HCT: 37.7 % (ref 36.0–46.0)
Hemoglobin: 12.2 g/dL (ref 12.0–15.0)
MCH: 29.3 pg (ref 26.0–34.0)
MCHC: 32.4 g/dL (ref 30.0–36.0)
MCV: 90.4 fL (ref 80.0–100.0)
Platelets: 231 10*3/uL (ref 150–400)
RBC: 4.17 MIL/uL (ref 3.87–5.11)
RDW: 16 % — ABNORMAL HIGH (ref 11.5–15.5)
WBC: 5.7 10*3/uL (ref 4.0–10.5)
nRBC: 0 % (ref 0.0–0.2)

## 2021-11-10 LAB — COMPREHENSIVE METABOLIC PANEL
ALT: 11 U/L (ref 0–44)
AST: 12 U/L — ABNORMAL LOW (ref 15–41)
Albumin: 3.6 g/dL (ref 3.5–5.0)
Alkaline Phosphatase: 71 U/L (ref 38–126)
Anion gap: 6 (ref 5–15)
BUN: 10 mg/dL (ref 6–20)
CO2: 28 mmol/L (ref 22–32)
Calcium: 8.7 mg/dL — ABNORMAL LOW (ref 8.9–10.3)
Chloride: 106 mmol/L (ref 98–111)
Creatinine, Ser: 0.81 mg/dL (ref 0.44–1.00)
GFR, Estimated: >60 mL/min (ref >60)
Glucose, Bld: 94 mg/dL (ref 70–99)
Potassium: 4 mmol/L (ref 3.5–5.1)
Sodium: 140 mmol/L (ref 135–145)
Total Bilirubin: 0.4 mg/dL (ref 0.3–1.2)
Total Protein: 6.3 g/dL — ABNORMAL LOW (ref 6.5–8.1)

## 2021-11-10 LAB — SARS CORONAVIRUS 2 (TAT 6-24 HRS): SARS Coronavirus 2: NEGATIVE

## 2021-11-10 LAB — SURGICAL PCR SCREEN
MRSA, PCR: NEGATIVE
Staphylococcus aureus: NEGATIVE

## 2021-11-10 NOTE — Progress Notes (Signed)
PCP - Marda Stalker, PA-C ?Cardiologist - denies ? ?PPM/ICD - denies ? ? ?Chest x-ray - 07/09/2002 ?EKG - 11/10/2021 at PAT ?Stress Test - denies ?ECHO - denies ?Cardiac Cath - denies ? ?Sleep Study - denies ? ?DM- denies ? ? ?ASA/Blood Thinner Instructions: n/a ? ? ?ERAS Protcol - yes, Ensure given at PAT ? ? ?COVID TEST- 11/10/21 ? ? ?Anesthesia review: no ? ?Patient denies shortness of breath, fever, cough and chest pain at PAT appointment ? ? ?All instructions explained to the patient, with a verbal understanding of the material. Patient agrees to go over the instructions while at home for a better understanding. Patient also instructed to wear a mask in public after being tested for COVID-19. The opportunity to ask questions was provided. ?  ?

## 2021-11-12 NOTE — Anesthesia Preprocedure Evaluation (Addendum)
Anesthesia Evaluation  ?Patient identified by MRN, date of birth, ID band ?Patient awake ? ? ? ?Reviewed: ?Allergy & Precautions, NPO status , Patient's Chart, lab work & pertinent test results ? ?Airway ?Mallampati: II ? ?TM Distance: >3 FB ?Neck ROM: Full ? ? ? Dental ?no notable dental hx. ? ?  ?Pulmonary ?neg pulmonary ROS,  ?  ?Pulmonary exam normal ?breath sounds clear to auscultation ? ? ? ? ? ? Cardiovascular ?Exercise Tolerance: Good ?negative cardio ROS ?Normal cardiovascular exam ?Rhythm:Regular Rate:Normal ? ? ?  ?Neuro/Psych ?PSYCHIATRIC DISORDERS Anxiety Depression negative neurological ROS ?   ? GI/Hepatic ?negative GI ROS, Neg liver ROS,   ?Endo/Other  ?prediabetes ? Renal/GU ?negative Renal ROS  ?negative genitourinary ?  ?Musculoskeletal ?negative musculoskeletal ROS ?(+) Cervical disc protrusion  ? Abdominal ?  ?Peds ?negative pediatric ROS ?(+)  Hematology ?negative hematology ROS ?(+)   ?Anesthesia Other Findings ? ? Reproductive/Obstetrics ?negative OB ROS ? ?  ? ? ? ? ? ? ? ? ? ? ? ? ? ?  ?  ? ? ? ? ? ? ? ?Anesthesia Physical ?Anesthesia Plan ? ?ASA: 2 ? ?Anesthesia Plan: General  ? ?Post-op Pain Management: Tylenol PO (pre-op)*  ? ?Induction: Intravenous ? ?PONV Risk Score and Plan: 3 and Scopolamine patch - Pre-op, Midazolam, Dexamethasone and Ondansetron ? ?Airway Management Planned: Oral ETT ? ?Additional Equipment: None ? ?Intra-op Plan:  ? ?Post-operative Plan: Extubation in OR ? ?Informed Consent: I have reviewed the patients History and Physical, chart, labs and discussed the procedure including the risks, benefits and alternatives for the proposed anesthesia with the patient or authorized representative who has indicated his/her understanding and acceptance.  ? ? ? ?Dental advisory given ? ?Plan Discussed with: CRNA, Anesthesiologist and Surgeon ? ?Anesthesia Plan Comments: (GETA. )  ? ? ? ? ? ?Anesthesia Quick Evaluation ? ?

## 2021-11-13 ENCOUNTER — Ambulatory Visit (HOSPITAL_COMMUNITY): Payer: 59

## 2021-11-13 ENCOUNTER — Encounter (HOSPITAL_COMMUNITY)
Admission: RE | Disposition: A | Discharge status: Home / Self Care | Payer: Self-pay | Source: Home / Self Care | Attending: Orthopaedic Surgery

## 2021-11-13 ENCOUNTER — Ambulatory Visit (HOSPITAL_BASED_OUTPATIENT_CLINIC_OR_DEPARTMENT_OTHER): Payer: 59 | Admitting: Vascular Surgery

## 2021-11-13 ENCOUNTER — Encounter (HOSPITAL_COMMUNITY): Payer: Self-pay | Admitting: Orthopaedic Surgery

## 2021-11-13 ENCOUNTER — Observation Stay (HOSPITAL_COMMUNITY)
Admission: RE | Admit: 2021-11-13 | Discharge: 2021-11-14 | Disposition: A | Discharge status: Home / Self Care | Payer: 59 | Attending: Orthopaedic Surgery | Admitting: Orthopaedic Surgery

## 2021-11-13 ENCOUNTER — Other Ambulatory Visit: Payer: Self-pay

## 2021-11-13 ENCOUNTER — Ambulatory Visit (HOSPITAL_COMMUNITY): Payer: 59 | Admitting: Vascular Surgery

## 2021-11-13 DIAGNOSIS — Z419 Encounter for procedure for purposes other than remedying health state, unspecified: Secondary | ICD-10-CM

## 2021-11-13 DIAGNOSIS — M5020 Other cervical disc displacement, unspecified cervical region: Secondary | ICD-10-CM | POA: Diagnosis present

## 2021-11-13 DIAGNOSIS — M5011 Cervical disc disorder with radiculopathy,  high cervical region: Secondary | ICD-10-CM | POA: Diagnosis present

## 2021-11-13 DIAGNOSIS — M50223 Other cervical disc displacement at C6-C7 level: Secondary | ICD-10-CM

## 2021-11-13 DIAGNOSIS — M47892 Other spondylosis, cervical region: Secondary | ICD-10-CM

## 2021-11-13 DIAGNOSIS — Z01818 Encounter for other preprocedural examination: Secondary | ICD-10-CM

## 2021-11-13 LAB — POCT PREGNANCY, URINE: Preg Test, Ur: NEGATIVE

## 2021-11-13 SURGERY — CERVICAL ANTERIOR DISC ARTHROPLASTY
Anesthesia: General | Site: Spine Cervical

## 2021-11-13 MED ORDER — ONDANSETRON HCL 4 MG/2ML IJ SOLN: 4.0000 mg | Freq: Four times a day (QID) | INTRAMUSCULAR | Status: DC | PRN

## 2021-11-13 MED ORDER — GABAPENTIN 300 MG PO CAPS
300.0000 mg | ORAL_CAPSULE | Freq: Three times a day (TID) | ORAL | Status: DC
  Administered 2021-11-13 (×2): 300 mg via ORAL
  Filled 2021-11-13 (×2): qty 1

## 2021-11-13 MED ORDER — SODIUM CHLORIDE 0.9 % IV SOLN: 250.0000 mL | INTRAVENOUS | Status: DC

## 2021-11-13 MED ORDER — METHOCARBAMOL 500 MG PO TABS
500.0000 mg | ORAL_TABLET | Freq: Four times a day (QID) | ORAL | Status: DC | PRN
  Filled 2021-11-13: qty 1

## 2021-11-13 MED ORDER — PROPOFOL 10 MG/ML IV BOLUS
INTRAVENOUS | Status: AC
  Filled 2021-11-13: qty 20

## 2021-11-13 MED ORDER — SUGAMMADEX SODIUM 200 MG/2ML IV SOLN
INTRAVENOUS | Status: DC | PRN
Start: 2021-11-13 — End: 2021-11-13
  Administered 2021-11-13: 200 mg via INTRAVENOUS

## 2021-11-13 MED ORDER — SODIUM CHLORIDE 0.9 % IV SOLN: INTRAVENOUS | Status: DC

## 2021-11-13 MED ORDER — ROCURONIUM BROMIDE 10 MG/ML (PF) SYRINGE
PREFILLED_SYRINGE | INTRAVENOUS | Status: AC
  Filled 2021-11-13: qty 10

## 2021-11-13 MED ORDER — DEXAMETHASONE SODIUM PHOSPHATE 10 MG/ML IJ SOLN
INTRAMUSCULAR | Status: AC
  Filled 2021-11-13: qty 1

## 2021-11-13 MED ORDER — MIDAZOLAM HCL 2 MG/2ML IJ SOLN
INTRAMUSCULAR | Status: DC | PRN
Start: 2021-11-13 — End: 2021-11-13
  Administered 2021-11-13: 2 mg via INTRAVENOUS

## 2021-11-13 MED ORDER — ACETAMINOPHEN 325 MG PO TABS
650.0000 mg | ORAL_TABLET | ORAL | Status: DC | PRN
  Administered 2021-11-14: 650 mg via ORAL
  Filled 2021-11-13: qty 2

## 2021-11-13 MED ORDER — ACETAMINOPHEN 500 MG PO TABS
1000.0000 mg | ORAL_TABLET | Freq: Once | ORAL | Status: AC
  Administered 2021-11-13: 1000 mg via ORAL
  Filled 2021-11-13: qty 2

## 2021-11-13 MED ORDER — METHOCARBAMOL 500 MG PO TABS: 500.0000 mg | ORAL_TABLET | Freq: Four times a day (QID) | ORAL | 0 refills | Status: DC

## 2021-11-13 MED ORDER — SODIUM CHLORIDE 0.9% FLUSH: 3.0000 mL | INTRAVENOUS | Status: DC | PRN

## 2021-11-13 MED ORDER — OXYCODONE HCL 5 MG PO TABS: 5.0000 mg | ORAL_TABLET | Freq: Once | ORAL | Status: DC | PRN

## 2021-11-13 MED ORDER — ORAL CARE MOUTH RINSE: 15.0000 mL | Freq: Once | OROMUCOSAL | Status: AC

## 2021-11-13 MED ORDER — SCOPOLAMINE 1 MG/3DAYS TD PT72
1.0000 | MEDICATED_PATCH | TRANSDERMAL | Status: DC
  Administered 2021-11-13: 1.5 mg via TRANSDERMAL
  Filled 2021-11-13: qty 1

## 2021-11-13 MED ORDER — ONDANSETRON HCL 4 MG PO TABS: 4.0000 mg | ORAL_TABLET | Freq: Four times a day (QID) | ORAL | Status: DC | PRN

## 2021-11-13 MED ORDER — ROCURONIUM BROMIDE 10 MG/ML (PF) SYRINGE
PREFILLED_SYRINGE | INTRAVENOUS | Status: DC | PRN
  Administered 2021-11-13: 70 mg via INTRAVENOUS

## 2021-11-13 MED ORDER — LACTATED RINGERS IV SOLN: INTRAVENOUS | Status: DC

## 2021-11-13 MED ORDER — LIDOCAINE 2% (20 MG/ML) 5 ML SYRINGE
INTRAMUSCULAR | Status: AC
  Filled 2021-11-13: qty 5

## 2021-11-13 MED ORDER — CEFAZOLIN SODIUM-DEXTROSE 2-4 GM/100ML-% IV SOLN
2.0000 g | INTRAVENOUS | Status: AC
  Administered 2021-11-13: 2 g via INTRAVENOUS
  Filled 2021-11-13: qty 100

## 2021-11-13 MED ORDER — MIDAZOLAM HCL 2 MG/2ML IJ SOLN
INTRAMUSCULAR | Status: AC
  Filled 2021-11-13: qty 2

## 2021-11-13 MED ORDER — DOCUSATE SODIUM 100 MG PO CAPS
100.0000 mg | ORAL_CAPSULE | Freq: Two times a day (BID) | ORAL | Status: DC
  Administered 2021-11-13: 100 mg via ORAL
  Filled 2021-11-13: qty 1

## 2021-11-13 MED ORDER — LIDOCAINE 2% (20 MG/ML) 5 ML SYRINGE
INTRAMUSCULAR | Status: DC | PRN
  Administered 2021-11-13: 50 mg via INTRAVENOUS

## 2021-11-13 MED ORDER — PROPOFOL 10 MG/ML IV BOLUS
INTRAVENOUS | Status: DC | PRN
Start: 2021-11-13 — End: 2021-11-13
  Administered 2021-11-13: 150 mg via INTRAVENOUS

## 2021-11-13 MED ORDER — ONDANSETRON HCL 4 MG/2ML IJ SOLN: 4.0000 mg | Freq: Once | INTRAMUSCULAR | Status: DC | PRN

## 2021-11-13 MED ORDER — MENTHOL 3 MG MT LOZG
1.0000 | LOZENGE | OROMUCOSAL | Status: DC | PRN
  Filled 2021-11-13: qty 9

## 2021-11-13 MED ORDER — AMISULPRIDE (ANTIEMETIC) 5 MG/2ML IV SOLN: 10.0000 mg | Freq: Once | INTRAVENOUS | Status: DC | PRN

## 2021-11-13 MED ORDER — FENTANYL CITRATE (PF) 250 MCG/5ML IJ SOLN
INTRAMUSCULAR | Status: DC | PRN
  Administered 2021-11-13 (×2): 50 ug via INTRAVENOUS

## 2021-11-13 MED ORDER — PHENOL 1.4 % MT LIQD: 1.0000 | OROMUCOSAL | Status: DC | PRN

## 2021-11-13 MED ORDER — FLUOXETINE HCL 20 MG PO CAPS: 40.0000 mg | ORAL_CAPSULE | Freq: Every morning | ORAL | Status: DC

## 2021-11-13 MED ORDER — EPHEDRINE SULFATE-NACL 50-0.9 MG/10ML-% IV SOSY
PREFILLED_SYRINGE | INTRAVENOUS | Status: DC | PRN
  Administered 2021-11-13 (×5): 5 mg via INTRAVENOUS

## 2021-11-13 MED ORDER — CHLORHEXIDINE GLUCONATE 0.12 % MT SOLN
15.0000 mL | Freq: Once | OROMUCOSAL | Status: AC
  Administered 2021-11-13: 15 mL via OROMUCOSAL
  Filled 2021-11-13: qty 15

## 2021-11-13 MED ORDER — ONDANSETRON HCL 4 MG/2ML IJ SOLN
INTRAMUSCULAR | Status: AC
  Filled 2021-11-13: qty 2

## 2021-11-13 MED ORDER — POLYETHYLENE GLYCOL 3350 17 G PO PACK: 17.0000 g | PACK | Freq: Every day | ORAL | Status: DC

## 2021-11-13 MED ORDER — 0.9 % SODIUM CHLORIDE (POUR BTL) OPTIME
TOPICAL | Status: DC | PRN
  Administered 2021-11-13: 1000 mL

## 2021-11-13 MED ORDER — METHOCARBAMOL 1000 MG/10ML IJ SOLN
500.0000 mg | Freq: Four times a day (QID) | INTRAVENOUS | Status: DC | PRN
  Filled 2021-11-13: qty 5

## 2021-11-13 MED ORDER — BUPIVACAINE-EPINEPHRINE 0.5% -1:200000 IJ SOLN
INTRAMUSCULAR | Status: DC | PRN
  Administered 2021-11-13: 6 mL

## 2021-11-13 MED ORDER — OXYCODONE HCL 5 MG/5ML PO SOLN: 5.0000 mg | Freq: Once | ORAL | Status: DC | PRN

## 2021-11-13 MED ORDER — ACETAMINOPHEN 650 MG RE SUPP: 650.0000 mg | RECTAL | Status: DC | PRN

## 2021-11-13 MED ORDER — DEXAMETHASONE SODIUM PHOSPHATE 10 MG/ML IJ SOLN
INTRAMUSCULAR | Status: DC | PRN
  Administered 2021-11-13: 10 mg via INTRAVENOUS

## 2021-11-13 MED ORDER — FENTANYL CITRATE (PF) 100 MCG/2ML IJ SOLN: 25.0000 ug | INTRAMUSCULAR | Status: DC | PRN

## 2021-11-13 MED ORDER — ONDANSETRON HCL 4 MG/2ML IJ SOLN
INTRAMUSCULAR | Status: DC | PRN
  Administered 2021-11-13: 4 mg via INTRAVENOUS

## 2021-11-13 MED ORDER — FENTANYL CITRATE (PF) 250 MCG/5ML IJ SOLN
INTRAMUSCULAR | Status: AC
Start: 2021-11-13 — End: ?
  Filled 2021-11-13: qty 5

## 2021-11-13 MED ORDER — SODIUM CHLORIDE 0.9% FLUSH
3.0000 mL | Freq: Two times a day (BID) | INTRAVENOUS | Status: DC
  Administered 2021-11-13 (×2): 3 mL via INTRAVENOUS

## 2021-11-13 MED ORDER — PHENYLEPHRINE 40 MCG/ML (10ML) SYRINGE FOR IV PUSH (FOR BLOOD PRESSURE SUPPORT)
PREFILLED_SYRINGE | INTRAVENOUS | Status: DC | PRN
  Administered 2021-11-13 (×2): 40 ug via INTRAVENOUS
  Administered 2021-11-13: 80 ug via INTRAVENOUS
  Administered 2021-11-13 (×4): 40 ug via INTRAVENOUS
  Administered 2021-11-13: 80 ug via INTRAVENOUS

## 2021-11-13 MED ORDER — EPHEDRINE 5 MG/ML INJ
INTRAVENOUS | Status: AC
  Filled 2021-11-13: qty 5

## 2021-11-13 MED ORDER — BUPIVACAINE HCL (PF) 0.5 % IJ SOLN
INTRAMUSCULAR | Status: AC
  Filled 2021-11-13: qty 30

## 2021-11-13 MED ORDER — HYDROMORPHONE HCL 1 MG/ML IJ SOLN: 0.5000 mg | INTRAMUSCULAR | Status: DC | PRN

## 2021-11-13 MED ORDER — BUPROPION HCL ER (XL) 150 MG PO TB24
150.0000 mg | ORAL_TABLET | Freq: Every morning | ORAL | Status: DC
  Filled 2021-11-13: qty 1

## 2021-11-13 MED ORDER — OXYCODONE HCL 5 MG PO TABS
5.0000 mg | ORAL_TABLET | ORAL | Status: DC | PRN
  Filled 2021-11-13: qty 1

## 2021-11-13 MED ORDER — HEMOSTATIC AGENTS (NO CHARGE) OPTIME
TOPICAL | Status: DC | PRN
  Administered 2021-11-13: 1 via TOPICAL

## 2021-11-13 MED ORDER — OXYCODONE-ACETAMINOPHEN 5-325 MG PO TABS: 1.0000 | ORAL_TABLET | ORAL | 0 refills | Status: DC | PRN

## 2021-11-13 SURGICAL SUPPLY — 57 items
BAG COUNTER SPONGE SURGICOUNT (BAG) ×2 IMPLANT
BENZOIN TINCTURE PRP APPL 2/3 (GAUZE/BANDAGES/DRESSINGS) ×1 IMPLANT
BIT MILLING PRODISC 2.0 STER (BIT) ×1 IMPLANT
BLADE CLIPPER SURG (BLADE) IMPLANT
BUR ROUND FLUTED 4 SOFT TCH (BURR) IMPLANT
COLLAR CERV LO CONTOUR FIRM DE (SOFTGOODS) ×2 IMPLANT
CORD BIPOLAR FORCEPS 12FT (ELECTRODE) IMPLANT
COVER MAYO STAND STRL (DRAPES) ×2 IMPLANT
COVER SURGICAL LIGHT HANDLE (MISCELLANEOUS) ×2 IMPLANT
DERMABOND ADVANCED (GAUZE/BANDAGES/DRESSINGS) ×1
DERMABOND ADVANCED .7 DNX12 (GAUZE/BANDAGES/DRESSINGS) ×1 IMPLANT
DISC PRODISC-C MED 5MM (Neuro Prosthesis/Implant) ×1 IMPLANT
DRAPE C-ARM 42X72 X-RAY (DRAPES) ×2 IMPLANT
DRAPE HALF SHEET 40X57 (DRAPES) ×2 IMPLANT
DRAPE MICROSCOPE LEICA (MISCELLANEOUS) ×2 IMPLANT
DRSG MEPILEX BORDER 4X4 (GAUZE/BANDAGES/DRESSINGS) ×2 IMPLANT
DRSG MEPILEX BORDER 4X8 (GAUZE/BANDAGES/DRESSINGS) ×2 IMPLANT
DURAPREP 6ML APPLICATOR 50/CS (WOUND CARE) ×2 IMPLANT
ELECT COATED BLADE 2.86 ST (ELECTRODE) ×2 IMPLANT
ELECT REM PT RETURN 9FT ADLT (ELECTROSURGICAL) ×2
ELECTRODE REM PT RTRN 9FT ADLT (ELECTROSURGICAL) ×1 IMPLANT
EVACUATOR 1/8 PVC DRAIN (DRAIN) ×2 IMPLANT
GAUZE SPONGE 4X4 12PLY STRL (GAUZE/BANDAGES/DRESSINGS) ×2 IMPLANT
GAUZE XEROFORM 1X8 LF (GAUZE/BANDAGES/DRESSINGS) ×4 IMPLANT
GLOVE SRG 8 PF TXTR STRL LF DI (GLOVE) ×2 IMPLANT
GLOVE SURG ORTHO LTX SZ7.5 (GLOVE) ×4 IMPLANT
GLOVE SURG UNDER POLY LF SZ8 (GLOVE) ×2
GOWN STRL REUS W/ TWL LRG LVL3 (GOWN DISPOSABLE) ×1 IMPLANT
GOWN STRL REUS W/ TWL XL LVL3 (GOWN DISPOSABLE) ×1 IMPLANT
GOWN STRL REUS W/TWL 2XL LVL3 (GOWN DISPOSABLE) ×2 IMPLANT
GOWN STRL REUS W/TWL LRG LVL3 (GOWN DISPOSABLE) ×1
GOWN STRL REUS W/TWL XL LVL3 (GOWN DISPOSABLE) ×1
HALTER HD/CHIN CERV TRACTION D (MISCELLANEOUS) ×2 IMPLANT
HEMOSTAT SURGICEL 2X14 (HEMOSTASIS) IMPLANT
KIT BASIN OR (CUSTOM PROCEDURE TRAY) ×2 IMPLANT
KIT TURNOVER KIT B (KITS) ×2 IMPLANT
MANIFOLD NEPTUNE II (INSTRUMENTS) ×2 IMPLANT
NDL 25GX 5/8IN NON SAFETY (NEEDLE) ×1 IMPLANT
NEEDLE 25GX 5/8IN NON SAFETY (NEEDLE) ×2 IMPLANT
NS IRRIG 1000ML POUR BTL (IV SOLUTION) ×2 IMPLANT
PACK ORTHO CERVICAL (CUSTOM PROCEDURE TRAY) ×2 IMPLANT
PAD ARMBOARD 7.5X6 YLW CONV (MISCELLANEOUS) ×4 IMPLANT
PATTIES SURGICAL .5 X.5 (GAUZE/BANDAGES/DRESSINGS) IMPLANT
PIN BLUNT FIXATION (PIN) ×1 IMPLANT
PIN FIX TEMP SHARP PRODISC (PIN) ×1 IMPLANT
STAPLER VISISTAT 35W (STAPLE) ×2 IMPLANT
STRIP CLOSURE SKIN 1/2X4 (GAUZE/BANDAGES/DRESSINGS) ×1 IMPLANT
SURGIFLO W/THROMBIN 8M KIT (HEMOSTASIS) ×1 IMPLANT
SUT VIC AB 3-0 X1 27 (SUTURE) ×2 IMPLANT
SUT VICRYL 4-0 PS2 18IN ABS (SUTURE) ×4 IMPLANT
SYR 30ML SLIP (SYRINGE) ×2 IMPLANT
SYR BULB EAR ULCER 3OZ GRN STR (SYRINGE) ×2 IMPLANT
TAPE CLOTH 4X10 WHT NS (GAUZE/BANDAGES/DRESSINGS) ×1 IMPLANT
TIP INSERTER MEDIUM (INSTRUMENTS) ×1 IMPLANT
TOWEL GREEN STERILE (TOWEL DISPOSABLE) ×2 IMPLANT
TOWEL GREEN STERILE FF (TOWEL DISPOSABLE) ×2 IMPLANT
WATER STERILE IRR 1000ML POUR (IV SOLUTION) ×2 IMPLANT

## 2021-11-13 NOTE — Anesthesia Postprocedure Evaluation (Signed)
Anesthesia Post Note ? ?Patient: Cheryl Juarez ? ?Procedure(s) Performed: C6-7 CERVICAL ANTERIOR DISC ARTHROPLASTY (Spine Cervical) ? ?  ? ?Patient location during evaluation: PACU ?Anesthesia Type: General ?Level of consciousness: awake ?Pain management: pain level controlled ?Vital Signs Assessment: post-procedure vital signs reviewed and stable ?Respiratory status: spontaneous breathing and respiratory function stable ?Cardiovascular status: stable ?Postop Assessment: no apparent nausea or vomiting ?Anesthetic complications: no ? ? ?No notable events documented. ? ?Last Vitals:  ?Vitals:  ? 11/13/21 1130 11/13/21 1143  ?BP: 106/61 113/70  ?Pulse: 79 76  ?Resp: 12 18  ?Temp: 36.7 ?C 36.8 ?C  ?SpO2: 94% 98%  ?  ?Last Pain:  ?Vitals:  ? 11/13/21 1143  ?TempSrc: Oral  ?PainSc:   ? ? ?  ?  ?  ?  ?  ?  ? ?Cheryl Juarez ? ? ? ? ?

## 2021-11-13 NOTE — Anesthesia Procedure Notes (Signed)
Procedure Name: Intubation ?Date/Time: 11/13/2021 7:42 AM ?Performed by: Michele Rockers, CRNA ?Pre-anesthesia Checklist: Patient identified, Patient being monitored, Timeout performed, Emergency Drugs available and Suction available ?Patient Re-evaluated:Patient Re-evaluated prior to induction ?Oxygen Delivery Method: Circle system utilized ?Preoxygenation: Pre-oxygenation with 100% oxygen ?Induction Type: IV induction ?Ventilation: Mask ventilation without difficulty ?Laryngoscope Size: Mac, 3 and Glidescope ?Grade View: Grade I ?Tube type: Oral ?Tube size: 7.0 mm ?Number of attempts: 1 ?Airway Equipment and Method: Stylet ?Placement Confirmation: ETT inserted through vocal cords under direct vision, positive ETCO2 and breath sounds checked- equal and bilateral ?Secured at: 21 cm ?Tube secured with: Tape ?Dental Injury: Teeth and Oropharynx as per pre-operative assessment  ? ? ? ? ?

## 2021-11-13 NOTE — Interval H&P Note (Signed)
History and Physical Interval Note: ? ?11/13/2021 ?7:23 AM ? ?Cheryl Juarez  has presented today for surgery, with the diagnosis of C6-7 cervical disc protrusion, stenosis.  The various methods of treatment have been discussed with the patient and family. After consideration of risks, benefits and other options for treatment, the patient has consented to  Procedure(s): ?C6-7 CERVICAL ANTERIOR DISC ARTHROPLASTY (N/A) as a surgical intervention.  The patient's history has been reviewed, patient examined, no change in status, stable for surgery.  I have reviewed the patient's chart and labs.  Questions were answered to the patient's satisfaction.   ? ? ?Cheryl Juarez ? ? ?

## 2021-11-13 NOTE — H&P (Signed)
Cheryl Juarez is an 41 y.o. female.   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  Symptoms unchanged from previous visit.  She is wanting to proceed with C6-7 cervical disc arthroplasty as scheduled ? ?Past Medical History:  ?Diagnosis Date  ? Anemia   ? hx of iron-deficiency anemia, pt states "it comes and goes"  ? Anxiety   ? Depression   ? Pre-diabetes   ? ? ?Past Surgical History:  ?Procedure Laterality Date  ? EYE SURGERY Left 1987  ? to fix lazy eye  ? PATELLAR TENDON REPAIR Left 1997  ? THYROIDECTOMY, PARTIAL  2003  ? ? ?Family History  ?Problem Relation Age of Onset  ? Breast cancer Paternal Aunt   ? Breast cancer Paternal Grandmother   ? ?Social History:  reports that she has never smoked. She has never used smokeless tobacco. She reports current alcohol use. She reports that she does not use drugs. ? ?Allergies: No Known Allergies ? ?No medications prior to admission.  ? ? ?No results found for this or any previous visit (from the past 48 hour(s)). ?No results found. ? ?Review of Systems  ?Constitutional:  Positive for activity change.  ?HENT: Negative.    ?Respiratory: Negative.    ?Cardiovascular: Negative.   ?Gastrointestinal: Negative.   ?Genitourinary: Negative.   ?Musculoskeletal:  Positive for neck pain and neck stiffness.  ?Neurological:  Positive for numbness.  ?Psychiatric/Behavioral: Negative.    ? ?Last menstrual period 11/02/2021. ?Physical Exam ?HENT:  ?   Head: Normocephalic.  ?   Nose: Nose normal.  ?Eyes:  ?   Extraocular Movements: Extraocular movements intact.  ?Pulmonary:  ?   Effort: Pulmonary effort is normal. No respiratory distress.  ?Abdominal:  ?   General: There is no distension.  ?Musculoskeletal:     ?   General: Tenderness present.  ?Neurological:  ?   Mental Status: She is alert and oriented to person, place, and time.  ?Psychiatric:     ?   Mood  and Affect: Mood normal.  ?  ? 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 ?We will proceed with C6-7 disc arthroplasty as scheduled.  Surgical procedure discussed along with potential recovery time.  All questions answered. ? ?Zonia Kief, PA-C ?11/13/2021, 4:13 AM ? ? ? ?

## 2021-11-13 NOTE — Progress Notes (Signed)
Orthopedic Tech Progress Note ?Patient Details:  ?Shakyra Mattera ?Jan 02, 1981 ?[id number] ? ?RN called requesting an EXTRA soft collar for patient. For when she's showering  ?Ortho Devices ?Type of Ortho Device: Soft collar ?Ortho Device/Splint Interventions: Ordered, Other (comment) ?  ?Post Interventions ?Patient Tolerated: Well ?Instructions Provided: Care of device ? ?Donald Pore ?11/13/2021, 11:59 AM ? ?

## 2021-11-13 NOTE — Discharge Instructions (Signed)
Ok to shower 5 days postop.  Do not apply any creams or ointments to incision.  Do not remove steri-strips or dressing.  No aggressive activity. Cervical collar must be on at all times including when showering.  Do not bend or turn neck.    ? ? ?No driving.  ? ? ?No pushing, pulling, lifting ?

## 2021-11-13 NOTE — Progress Notes (Signed)
Orthopedic Tech Progress Note ?Patient Details:  ?Cheryl Juarez ?April 09, 1981 ?[id number] ? ?PACU RN stated "patient has on SOFT COLLAR" ? ?Patient ID: [name], female   DOB: May 11, 1981, 41 y.o.   MRN: [id number] ? ?Donald Pore ?11/13/2021, 10:38 AM ? ?

## 2021-11-13 NOTE — Transfer of Care (Signed)
Immediate Anesthesia Transfer of Care Note ? ?Patient: Cheryl Juarez ? ?Procedure(s) Performed: C6-7 CERVICAL ANTERIOR DISC ARTHROPLASTY (Spine Cervical) ? ?Patient Location: PACU ? ?Anesthesia Type:General ? ?Level of Consciousness: drowsy, patient cooperative and responds to stimulation ? ?Airway & Oxygen Therapy: Patient Spontanous Breathing and Patient connected to nasal cannula oxygen ? ?Post-op Assessment: Report given to RN, Post -op Vital signs reviewed and stable and Patient moving all extremities X 4 ? ?Post vital signs: Reviewed and stable ? ?Last Vitals:  ?Vitals Value Taken Time  ?BP 118/60 11/13/21 1029  ?Temp    ?Pulse 86 11/13/21 1032  ?Resp    ?SpO2 100 % 11/13/21 1032  ?Vitals shown include unvalidated device data. ? ?Last Pain:  ?Vitals:  ? 11/13/21 0606  ?TempSrc:   ?PainSc: 0-No pain  ?   ? ?  ? ?Complications: No notable events documented. ?

## 2021-11-13 NOTE — Op Note (Addendum)
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 ?Procedure: C6-7 cervical disc arthroplasty.  Prodisc C. ? ?Surgeon: Annell Greening, MD ? ?Assistant: Zonia Kief, PA-C medically necessary and present for the entire procedure ? ?Anesthesia: General +6 cc Marcaine skin local ? ?EBL less than 100 cc ? ?Implants: Prodisc C medium 15 x 12 x 5 mm. ? ?Complications: None. ? ?Procedure after induction general esthesia orotracheal intubation prepping with DuraPrep and ultra traction restraints were applied for pulldown for visualization using the C arm during the case.  After DuraPrep and dried area squared with towel sterile skin marker was used on her old thyroid incision which was at the appropriate skin level.  Sterile skin marker Betadine Steri-Drape sterile Mayo stand at the head and thyroid sheets and drapes were applied. ? ?Ancef prophylaxis IV and timeout procedure completed. ? ?Incision was made starting the midline extending to the left.  Platysma was divided scar tissue was spread.  Carotid sheath contents was taken lateral esophagus and trachea medially.  Palpable spur at C6-7 short 25 needle placed confirmed with sterilely draped lateral C arm confirming this is the correct level.  Small interspersed were removed and the operative microscope was brought in after self-retaining client retractors were placed underneath the longus Coley teeth blades right and left smooth blade cephalad caudad.  Discectomy was performed.  Using the operative microscope we proceeded back to posterior longitudinal ligament took down the ligament completely and removed disc fragments were pressing paracentral right and off to the right foramina causing radiculopathy symptoms.  Once full decompression initial guide was used with fluids and carefully adjusted under C arm.  Patient had some cervical curvature with the lower portion of her cervical spine hand with slight curve to the left.  Went to airplane  the table slightly and just entry points to make sure we are progressing from anterior posterior once was was checked AP and lateral good position it appeared that the 5 mm spacer height was perfect instead of the normal 14 we backed off to a 12.  This was slowly carefully inserted checked under C arm AP and lateral intermittently using the C armor drape.  Once it was in good position impaction cutting the keels marking the with the purple marker in the midline and then disc arthroplasty was opened and carefully inserted progressing back.  It.  Needed to go back little bit further we tapped down the bottom side first and the top.  Care was taken up with insertion make sure that the top portion was in the correct position.  Hand was removed we progressed with the tap on the bottom side first on the top and had repeat this several times to slowly back up 1 to 2 mm until it was countersunk slightly good position flush with the posterior cortex and had actually corrected some of the mild curvature in the patient's neck with the implant in position.  Final spot pictures were taken.  Some Surgiflo been used in the epidural space this was removed prior to placement the implant and epidural space was dry operative field was dry.  Copious irrigation recheck for any bleeders Hemovac placed in and out technique with the trocar in line with skin incision on the left platysma closed with 3-0 Vicryl 4-0 Vicryl subcuticular closure tincture benzoin Steri-Strips Marcaine infiltration for a fourth postop dressing and transferred recovery room in stable condition.  Soft collar was applied. ? ?Implants ? ?DISC PRODISC-C MED -  [id number] ? ?Inventory Item: DISC PRODISC-C MED Serial no.:  Model/Cat no.: [id number]  ?Implant name: DISC PRODISC-C MED - [id number] Laterality: N/A Area: Spine Cervical  ?Manufacturer: [employer] Date of Manufacture:    ?Action: Implanted Number Used: 1   ?Device Identifier:  Device Identifier  Type:    ? ?

## 2021-11-14 DIAGNOSIS — M5011 Cervical disc disorder with radiculopathy,  high cervical region: Secondary | ICD-10-CM | POA: Diagnosis not present

## 2021-11-14 NOTE — Evaluation (Signed)
Occupational Therapy Evaluation ?Patient Details ?Name: Cheryl Juarez ?MRN: [id number] ?DOB: [birth year] ?Today's Date: 11/14/2021 ? ? ?History of Present Illness Cheryl Juarez is an 41 y.o. female who underwent C6-7 CERVICAL ANTERIOR DISC ARTHROPLASTY 3/13. PMHx: anemia, anxiety, depression, pre-diabetes  ? ?Clinical Impression ?  ?[name] was evaluated s/p the above cervical sx, she is indep at baseline with all ADL/IADLs. She lives alone in a 1 level home and works from home, he parents near by who can assist as needed. After review of cervical precautions and compensatory techniques, pt demonstrated mod I ability to complete ADLs. She does not require further OT. Recommend d/c home with assist from family as needed.   ? ?Recommendations for follow up therapy are one component of a multi-disciplinary discharge planning process, led by the attending physician.  Recommendations may be updated based on patient status, additional functional criteria and insurance authorization.  ? ?Follow Up Recommendations ? No OT follow up  ?  ?Assistance Recommended at Discharge Intermittent Supervision/Assistance  ?Patient can return home with the following Assist for transportation ? ?  ?Functional Status Assessment ? Patient has had a recent decline in their functional status and demonstrates the ability to make significant improvements in function in a reasonable and predictable amount of time.  ?Equipment Recommendations ? None recommended by OT  ?  ?   ?Precautions / Restrictions Precautions ?Precautions: Fall;Cervical ?Precaution Booklet Issued: Yes (comment) ?Required Braces or Orthoses: Cervical Brace ?Cervical Brace: Soft collar;At all times ?Restrictions ?Weight Bearing Restrictions: No  ? ?  ? ?Mobility Bed Mobility ?Overal bed mobility: Modified Independent ?  ?  ?  ?  ?  ?  ?General bed mobility comments: great ability to log roll ?  ? ?Transfers ?Overall transfer level: Independent ?  ?  ?  ?  ?  ?  ?   ?  ?General transfer comment: no AD ?  ? ?  ?Balance Overall balance assessment: Modified Independent, No apparent balance deficits (not formally assessed) ?  ?  ?  ?   ? ?ADL either performed or assessed with clinical judgement  ? ?ADL Overall ADL's : Modified independent ?  ?  ?  ?  ?  ?  ?  ?  ?  ?General ADL Comments: pt demonstreated mod I abiltiy to complete ADLs after review of cervical precautions and compensatory techniques; no AD required.  ? ? ? ?Vision Baseline Vision/History: 1 Wears glasses ?Ability to See in Adequate Light: 0 Adequate ?Vision Assessment?: No apparent visual deficits  ?   ?   ?   ? ?Pertinent Vitals/Pain Pain Assessment ?Pain Assessment: Faces ?Faces Pain Scale: Hurts a little bit ?Pain Location: throat ?Pain Descriptors / Indicators: Discomfort ?Pain Intervention(s): Limited activity within patient's tolerance, Monitored during session  ? ? ? ?Hand Dominance   ?  ?Extremity/Trunk Assessment Upper Extremity Assessment ?Upper Extremity Assessment: Overall WFL for tasks assessed ?  ?Lower Extremity Assessment ?Lower Extremity Assessment: Overall WFL for tasks assessed ?  ?Cervical / Trunk Assessment ?Cervical / Trunk Assessment: Neck Surgery ?  ?Communication Communication ?Communication: No difficulties ?  ?Cognition Arousal/Alertness: Awake/alert ?Behavior During Therapy: [facility] for tasks assessed/performed ?Overall Cognitive Status: Within Functional Limits for tasks assessed ?  ?  ?  ?  ?  ?  ?General Comments  VSS on RA ? ?  ? ?Home Living Family/patient expects to be discharged to:: Private residence ?Living Arrangements: Alone ?Available Help at Discharge: Family;Available 24 hours/day ?Type of Home: House ?Home Access:  Stairs to enter ?Entrance Stairs-Number of Steps: 3 ?Entrance Stairs-Rails: None ?Home Layout: One level ?  ?  ?Bathroom Shower/Tub: Tub/shower unit ?  ?Bathroom Toilet: Handicapped height ?  ?  ?Home Equipment: None (dad has equipment if needed) ?  ?Additional  Comments: lives alone, parents near by and can assist as needed ?  ? ?  ?Prior Functioning/Environment Prior Level of Function : Independent/Modified Independent ?  ?  ?  ?  ?  ?  ?Mobility Comments: indep, no AD ?ADLs Comments: indep, drives and works from home ?  ? ?  ?  ?OT Problem List: Decreased activity tolerance;Decreased safety awareness;Decreased knowledge of precautions ?  ?   ?   ?OT Goals(Current goals can be found in the care plan section) Acute Rehab OT Goals ?Patient Stated Goal: home soon ?OT Goal Formulation: With patient ?Time For Goal Achievement: 11/14/21 ?Potential to Achieve Goals: Good  ? ?AM-PAC OT "6 Clicks" Daily Activity     ?Outcome Measure Help from another person eating meals?: None ?Help from another person taking care of personal grooming?: None ?Help from another person toileting, which includes using toliet, bedpan, or urinal?: None ?Help from another person bathing (including washing, rinsing, drying)?: None ?Help from another person to put on and taking off regular upper body clothing?: None ?Help from another person to put on and taking off regular lower body clothing?: None ?6 Click Score: 24 ?  ?End of Session Nurse Communication: Mobility status ? ?Activity Tolerance: Patient tolerated treatment well ?Patient left: in bed;with call bell/phone within reach ? ?OT Visit Diagnosis: Other abnormalities of gait and mobility (R26.89)  ?              ?Time: 7564-3329 ?OT Time Calculation (min): 13 min ?Charges:  OT General Charges ?$OT Visit: 1 Visit ?OT Evaluation ?$OT Eval Low Complexity: 1 Low ? ? ?Maxmilian Trostel A Shelsey Rieth ?11/14/2021, 8:46 AM ?

## 2021-11-14 NOTE — Discharge Summary (Signed)
? ?Patient ID: ?Cheryl Juarez ?MRN: [id number] ?DOB/AGE: 05-01-1981 41 y.o. ? ?Admit date: 11/13/2021 ?Discharge date: 11/14/2021 ? ?Admission Diagnoses:  ?Principal Problem: ?  HNP (herniated nucleus pulposus), cervical ? ? ?Discharge Diagnoses:  ?Principal Problem: ?  HNP (herniated nucleus pulposus), cervical ? status post Procedure(s): ?C6-7 CERVICAL ANTERIOR DISC ARTHROPLASTY ? ?Past Medical History:  ?Diagnosis Date  ? Anemia   ? hx of iron-deficiency anemia, pt states "it comes and goes"  ? Anxiety   ? Depression   ? Pre-diabetes   ? ? ?Surgeries: Procedure(s): ?C6-7 CERVICAL ANTERIOR DISC ARTHROPLASTY on 11/13/2021 ?  ?Consultants:  ? ?Discharged Condition: Improved ? ?Hospital Course: Cheryl Juarez is an 41 y.o. female who was admitted 11/13/2021 for operative treatment of HNP (herniated nucleus pulposus), cervical. Patient failed conservative treatments (please see the history and physical for the specifics) and had severe unremitting pain that affects sleep, daily activities and work/hobbies. After pre-op clearance, the patient was taken to the operating room on 11/13/2021 and underwent  Procedure(s): ?C6-7 CERVICAL ANTERIOR DISC ARTHROPLASTY.   ? ?Patient was given perioperative antibiotics:  ?Anti-infectives (From admission, onward)  ? ? Start     Dose/Rate Route Frequency Ordered Stop  ? 11/13/21 0600  ceFAZolin (ANCEF) IVPB 2g/100 mL premix       ? 2 g ?200 mL/hr over 30 Minutes Intravenous On call to O.R. 11/13/21 0543 11/13/21 0805  ? ?  ?  ? ?Patient was given sequential compression devices and early ambulation to prevent DVT.  ? ?Patient benefited maximally from hospital stay and there were no complications. At the time of discharge, the patient was urinating/moving their bowels without difficulty, tolerating a regular diet, pain is controlled with oral pain medications and they have been cleared by PT/OT.  ? ?Recent vital signs: Patient Vitals for the past 24 hrs: ? BP Temp Temp src  Pulse Resp SpO2  ?11/14/21 0849 105/78 98.3 ?F (36.8 ?C) Oral 69 16 100 %  ?11/14/21 0541 113/62 98.2 ?F (36.8 ?C) Oral (!) 59 16 99 %  ?11/13/21 2330 (!) 100/58 98.8 ?F (37.1 ?C) Oral 67 18 97 %  ?11/13/21 2056 93/75 98.4 ?F (36.9 ?C) Oral 64 16 97 %  ?11/13/21 1524 112/61 98.4 ?F (36.9 ?C) Oral 77 16 97 %  ?  ? ?Recent laboratory studies: No results for input(s): WBC, HGB, HCT, PLT, NA, K, CL, CO2, BUN, CREATININE, GLUCOSE, INR, CALCIUM in the last 72 hours. ? ?Invalid input(s): PT, 2 ? ? ?Discharge Medications:   ?Allergies as of 11/14/2021   ?No Known Allergies ?  ? ?  ?Medication List  ?  ? ?STOP taking these medications   ? ?naproxen sodium 220 MG tablet ?Commonly known as: ALEVE ?  ? ?  ? ?TAKE these medications   ? ?buPROPion 150 MG 24 hr tablet ?Commonly known as: WELLBUTRIN XL ?Take 150 mg by mouth every morning. ?  ?FLUoxetine 40 MG capsule ?Commonly known as: PROZAC ?Take 40 mg by mouth every morning. ?  ?gabapentin 300 MG capsule ?Commonly known as: NEURONTIN ?TAKE 2 CAPSULES (600 MG TOTAL) BY MOUTH 3 (THREE) TIMES DAILY AS NEEDED. ?What changed:  ?how much to take ?when to take this ?additional instructions ?  ?methocarbamol 500 MG tablet ?Commonly known as: Robaxin ?Take 1 tablet (500 mg total) by mouth 4 (four) times daily. ?  ?oxyCODONE-acetaminophen 5-325 MG tablet ?Commonly known as: PERCOCET/ROXICET ?Take 1 tablet by mouth every 4 (four) hours as needed for severe pain. ?  ? ?  ? ? ?  Diagnostic Studies: DG Cervical Spine 2 or 3 views ? ?Result Date: 11/13/2021 ?CLINICAL DATA:  Anterior cervical disc arthroplasty at C6-7 by report. EXAM: CERVICAL SPINE - 2-3 VIEW; DG C-ARM 1-60 MIN-NO REPORT COMPARISON:  September 26, 2021. FINDINGS: Limited intraoperative fluoroscopic views are provided. The show changes of anterior cervical disc arthroplasty in the lower cervical spine. An endotracheal tube projects over the spine on the frontal view. Lateral projection and anterior projection show the arthroplasty  is position within the disc space in the lower cervical spine. Leveling difficult due to limited coverage. Three total images are provided. Fluoroscopic time: 59 seconds. Fluoroscopic dose: 8.72 mGy. IMPRESSION: 1. Limited intraoperative fluoroscopic views obtained during anterior cervical disc arthroplasty. Leveling difficult due to coned in views, frontal projection appears compatible with given history of disc arthroplasty at C6-7. Electronically Signed   By: Donzetta Kohut M.D.   On: 11/13/2021 10:29  ? ?DG C-Arm 1-60 Min-No Report ? ?Result Date: 11/13/2021 ?Fluoroscopy was utilized by the requesting physician.  No radiographic interpretation.  ? ?DG C-Arm 1-60 Min-No Report ? ?Result Date: 11/13/2021 ?Fluoroscopy was utilized by the requesting physician.  No radiographic interpretation.   ? ?Discharge Instructions   ? ? Incentive spirometry RT   Complete by: As directed ?  ? ?  ? ? ? Follow-up Information   ? ? Eldred Manges, MD. Schedule an appointment as soon as possible for a visit today.   ?Specialty: Orthopedic Surgery ?Why: need return office visit one week postop with dr Ophelia Charter.  call to schedule appointment. ?Contact information: ?[address] ?[place] [address] ?[phone] ? ? ?  ?  ? ?  ?  ? ?  ? ? ?Discharge Plan:  discharge to home ? ?Disposition:  ? ? ? ?Signed: ?Zonia Kief  ?11/14/2021, 2:53 PM ? ? ? ?  ?

## 2021-11-14 NOTE — Progress Notes (Signed)
Patient alert and oriented, mae's well, voiding adequate amount of urine, swallowing without difficulty, no c/o pain at time of discharge. Patient discharged home with family. Script and discharged instructions given to patient. Patient and family stated understanding of instructions given. Patient has an appointment with Dr. Yates in 1 week ?

## 2021-11-15 ENCOUNTER — Encounter (HOSPITAL_COMMUNITY): Payer: Self-pay | Admitting: Orthopaedic Surgery

## 2021-11-21 ENCOUNTER — Ambulatory Visit (INDEPENDENT_AMBULATORY_CARE_PROVIDER_SITE_OTHER): Payer: 59 | Admitting: Orthopaedic Surgery

## 2021-11-21 ENCOUNTER — Encounter: Payer: Self-pay | Admitting: Orthopaedic Surgery

## 2021-11-21 ENCOUNTER — Ambulatory Visit: Payer: Self-pay

## 2021-11-21 ENCOUNTER — Other Ambulatory Visit: Payer: Self-pay

## 2021-11-21 VITALS — BP 103/67 | HR 84 | Ht 66.0 in | Wt 161.0 lb

## 2021-11-21 DIAGNOSIS — M5020 Other cervical disc displacement, unspecified cervical region: Secondary | ICD-10-CM

## 2021-11-21 NOTE — Progress Notes (Signed)
? ?  Post-Op Visit Note ?  ?Patient: Cheryl Juarez           ?Date of Birth: 08-23-1981           ?MRN: [id number] ?Visit Date: 11/21/2021 ?PCP: Jarrett Soho, PA-C ? ? ?Assessment & Plan: Follow-up post disc arthroplasty C6-7.  X-rays look good she is on plain Tylenol.  Incision looks good Steri-Strips changed mild swelling she can use her collar off and on until the swelling settles down.  I will recheck her in 5 weeks for likely final visit.  Good relief of preop pain. ? ?Chief Complaint:  ?Chief Complaint  ?Patient presents with  ? Neck - Routine Post Op  ?  11/13/2021 C6-7 cervical anterior disc arthroplasty  ? ?Visit Diagnoses:  ?1. Cervical disc herniation   ? ? ?Plan: ROV 5 wks no xray needed.  ? ?Follow-Up Instructions: No follow-ups on file.  ? ?Orders:  ?Orders Placed This Encounter  ?Procedures  ? XR Cervical Spine 2 or 3 views  ? ?No orders of the defined types were placed in this encounter. ? ? ?Imaging: ?No results found. ? ?PMFS History: ?Patient Active Problem List  ? Diagnosis Date Noted  ? HNP (herniated nucleus pulposus), cervical 11/13/2021  ? Cervical disc herniation 10/11/2021  ? ?Past Medical History:  ?Diagnosis Date  ? Anemia   ? hx of iron-deficiency anemia, pt states "it comes and goes"  ? Anxiety   ? Depression   ? Pre-diabetes   ?  ?Family History  ?Problem Relation Age of Onset  ? Breast cancer Paternal Aunt   ? Breast cancer Paternal Grandmother   ?  ?Past Surgical History:  ?Procedure Laterality Date  ? CERVICAL DISC ARTHROPLASTY N/A 11/13/2021  ? Procedure: C6-7 CERVICAL ANTERIOR DISC ARTHROPLASTY;  Surgeon: Eldred Manges, MD;  Location: [facility];  Service: Orthopedics;  Laterality: N/A;  ? EYE SURGERY Left 1987  ? to fix lazy eye  ? PATELLAR TENDON REPAIR Left 1997  ? THYROIDECTOMY, PARTIAL  2003  ? ?Social History  ? ?Occupational History  ? Not on file  ?Tobacco Use  ? Smoking status: Never  ? Smokeless tobacco: Never  ?Vaping Use  ? Vaping Use: Never used  ?Substance and  Sexual Activity  ? Alcohol use: Yes  ?  Comment: maybe 1 mixed drink once a month  ? Drug use: Never  ? Sexual activity: Not on file  ? ? ? ?

## 2021-11-30 ENCOUNTER — Other Ambulatory Visit: Payer: Self-pay | Admitting: Orthopaedic Surgery

## 2021-11-30 ENCOUNTER — Telehealth: Payer: Self-pay | Admitting: Orthopaedic Surgery

## 2021-11-30 MED ORDER — GABAPENTIN 300 MG PO CAPS: 600.0000 mg | ORAL_CAPSULE | Freq: Three times a day (TID) | ORAL | 0 refills | Status: DC | PRN

## 2021-11-30 NOTE — Telephone Encounter (Signed)
Patient called stating she is having a burning sensation between her shoulder blades and about 3 inches above her shoulder blades. Patient said she stopped taking Gabapentin 2 weeks ago. Patient asked should she start back taking the Gabapentin? The number to contact patient is [phone]   ?

## 2021-11-30 NOTE — Telephone Encounter (Signed)
Please advise 

## 2021-12-26 ENCOUNTER — Encounter: Payer: Self-pay | Admitting: Orthopaedic Surgery

## 2021-12-26 ENCOUNTER — Ambulatory Visit (INDEPENDENT_AMBULATORY_CARE_PROVIDER_SITE_OTHER): Payer: 59 | Admitting: Orthopaedic Surgery

## 2021-12-26 VITALS — BP 95/66 | HR 89 | Ht 66.0 in | Wt 161.0 lb

## 2021-12-26 DIAGNOSIS — M5020 Other cervical disc displacement, unspecified cervical region: Secondary | ICD-10-CM

## 2021-12-26 NOTE — Progress Notes (Signed)
? ?  Post-Op Visit Note ?  ?Patient: Cheryl Juarez           ?Date of Birth: 1981-06-01           ?MRN: [id number] ?Visit Date: 12/26/2021 ?PCP: Jarrett Soho, PA-C ? ? ?Assessment & Plan: Post C6-7 disc arthroplasty.  Good relief of preop symptoms she is having the surgery result.  She is planning [place].  She can follow-up with me as needed. ?Chief Complaint:  ?Chief Complaint  ?Patient presents with  ? Neck - Routine Post Op  ? ?Visit Diagnoses:  ?1. HNP (herniated nucleus pulposus), cervical   ? ? ?Plan: Post C6-7 cervical disc arthroplasty she is happy the surgical result.  She is using Aleve occasionally return as needed. ? ?Follow-Up Instructions: Return if symptoms worsen or fail to improve.  ? ?Orders:  ?No orders of the defined types were placed in this encounter. ? ?No orders of the defined types were placed in this encounter. ? ? ?Imaging: ?No results found. ? ?PMFS History: ?Patient Active Problem List  ? Diagnosis Date Noted  ? HNP (herniated nucleus pulposus), cervical 11/13/2021  ? ?Past Medical History:  ?Diagnosis Date  ? Anemia   ? hx of iron-deficiency anemia, pt states "it comes and goes"  ? Anxiety   ? Depression   ? Pre-diabetes   ?  ?Family History  ?Problem Relation Age of Onset  ? Breast cancer Paternal Aunt   ? Breast cancer Paternal Grandmother   ?  ?Past Surgical History:  ?Procedure Laterality Date  ? CERVICAL DISC ARTHROPLASTY N/A 11/13/2021  ? Procedure: C6-7 CERVICAL ANTERIOR DISC ARTHROPLASTY;  Surgeon: Eldred Manges, MD;  Location: [facility] OR;  Service: Orthopedics;  Laterality: N/A;  ? EYE SURGERY Left 1987  ? to fix lazy eye  ? PATELLAR TENDON REPAIR Left 1997  ? THYROIDECTOMY, PARTIAL  2003  ? ?Social History  ? ?Occupational History  ? Not on file  ?Tobacco Use  ? Smoking status: Never  ? Smokeless tobacco: Never  ?Vaping Use  ? Vaping Use: Never used  ?Substance and Sexual Activity  ? Alcohol use: Yes  ?  Comment: maybe 1  mixed drink once a month  ? Drug use: Never  ? Sexual activity: Not on file  ? ? ? ?

## 2022-06-05 ENCOUNTER — Encounter: Payer: Self-pay | Admitting: Orthopaedic Surgery

## 2022-06-05 ENCOUNTER — Ambulatory Visit: Payer: BC Managed Care – PPO | Admitting: Orthopaedic Surgery

## 2022-06-05 ENCOUNTER — Ambulatory Visit (INDEPENDENT_AMBULATORY_CARE_PROVIDER_SITE_OTHER): Payer: BC Managed Care – PPO

## 2022-06-05 VITALS — BP 103/60 | HR 75 | Ht 66.0 in | Wt 160.0 lb

## 2022-06-05 DIAGNOSIS — M549 Dorsalgia, unspecified: Secondary | ICD-10-CM | POA: Diagnosis not present

## 2022-06-05 MED ORDER — GABAPENTIN 300 MG PO CAPS: 300.0000 mg | ORAL_CAPSULE | Freq: Three times a day (TID) | ORAL | 1 refills | Status: DC

## 2022-06-05 NOTE — Progress Notes (Signed)
Office Visit Note   Patient: Cheryl Juarez           Date of Birth: [birth year]           MRN: [id number] Visit Date: 06/05/2022              Requested by: Marda Stalker, PA-C [place],  [place] [address] PCP: Marda Stalker, PA-C   Assessment & Plan: Visit Diagnoses:  1. Mid back pain     Plan: We will set patient up for some physical therapy.  Recheck 6 weeks.  Gabapentin renewed.  Follow-Up Instructions: No follow-ups on file.   Orders:  Orders Placed This Encounter  Procedures   XR Thoracic Spine 2 View   Meds ordered this encounter  Medications   gabapentin (NEURONTIN) 300 MG capsule    Sig: Take 1 capsule (300 mg total) by mouth 3 (three) times daily.    Dispense:  90 capsule    Refill:  1      Procedures: No procedures performed   Clinical Data: No additional findings.   Subjective: Chief Complaint  Patient presents with   Middle Back - Pain    HPI 41 year old female returns post C6-7 disc arthroplasty 11/13/2021.  States she has had good relief of shoulder and hand pain but still has some pain at the T6 level midline.  She is back from her trip to the [facility].  She states when she was taking gabapentin this pain was much better and she states that pain has been present for several years.  She is use Aleve 2 p.o. twice daily.  She denies myelopathic symptoms no lower extremity weakness.  Patient states she is ran out of her gabapentin and is requesting a refill.  Review of Systems all other systems noncontributory updated unchanged.   Objective: Vital Signs: BP 103/60   Pulse 75   Ht 5\' 6"  (1.676 m)   Wt 160 lb (72.6 kg)   BMI 25.82 kg/m   Physical Exam Constitutional:      Appearance: She is well-developed.  HENT:     Head: Normocephalic.     Right Ear: External ear normal.     Left Ear: External ear normal. There is no impacted cerumen.  Eyes:     Pupils: Pupils are equal, round, and reactive to light.  Neck:      Thyroid: No thyromegaly.     Trachea: No tracheal deviation.  Cardiovascular:     Rate and Rhythm: Normal rate.  Pulmonary:     Effort: Pulmonary effort is normal.  Abdominal:     Palpations: Abdomen is soft.  Musculoskeletal:     Cervical back: No rigidity.  Skin:    General: Skin is warm and dry.  Neurological:     Mental Status: She is alert and oriented to person, place, and time.  Psychiatric:        Behavior: Behavior normal.     Ortho Exam cervical incisions well-healed.  No brachial plexus tenderness.  Tenderness at around T6 at the midline and about 2 cm on each side.  Normal scapular excursion no winging of the scapula good strength latissimus normal.  Reflexes are normal normal heel-toe gait good lower extremity strength.  Specialty Comments:  No specialty comments available.  Imaging: No results found.   PMFS History: Patient Active Problem List   Diagnosis Date Noted   HNP (herniated nucleus pulposus), cervical 11/13/2021   Past Medical History:  Diagnosis Date  Anemia    hx of iron-deficiency anemia, pt states "it comes and goes"   Anxiety    Depression    Pre-diabetes     Family History  Problem Relation Age of Onset   Breast cancer Paternal Aunt    Breast cancer Paternal Grandmother     Past Surgical History:  Procedure Laterality Date   CERVICAL DISC ARTHROPLASTY N/A 11/13/2021   Procedure: C6-7 CERVICAL ANTERIOR DISC ARTHROPLASTY;  Surgeon: Marybelle Killings, MD;  Location: [facility];  Service: Orthopedics;  Laterality: N/A;   EYE SURGERY Left 1987   to fix lazy eye   PATELLAR TENDON REPAIR Left 1997   THYROIDECTOMY, PARTIAL  2003   Social History   Occupational History   Not on file  Tobacco Use   Smoking status: Never   Smokeless tobacco: Never  Vaping Use   Vaping Use: Never used  Substance and Sexual Activity   Alcohol use: Yes    Comment: maybe 1 mixed drink once a month   Drug use: Never   Sexual activity: Not on file

## 2022-06-13 ENCOUNTER — Ambulatory Visit: Payer: 59 | Admitting: Orthopaedic Surgery

## 2022-07-17 ENCOUNTER — Encounter: Payer: Self-pay | Admitting: Orthopaedic Surgery

## 2022-07-17 ENCOUNTER — Ambulatory Visit: Payer: BC Managed Care – PPO | Admitting: Orthopaedic Surgery

## 2022-07-17 VITALS — BP 111/71 | HR 82 | Ht 66.0 in | Wt 160.0 lb

## 2022-07-17 DIAGNOSIS — Z9889 Other specified postprocedural states: Secondary | ICD-10-CM | POA: Insufficient documentation

## 2022-07-17 DIAGNOSIS — M546 Pain in thoracic spine: Secondary | ICD-10-CM | POA: Diagnosis not present

## 2022-07-17 NOTE — Progress Notes (Signed)
Office Visit Note   Patient: Cheryl Juarez           Date of Birth: 1981-04-03           MRN: [id number] Visit Date: 07/17/2022              Requested by: Jarrett Soho, PA-C [address] [place],  [place] [address] PCP: Jarrett Soho, PA-C   Assessment & Plan: Visit Diagnoses:  1. S/P cervical disc replacement   2. Pain in thoracic spine     Plan: We will proceed with physical therapy for thoracic spine discomfort.  Recheck her in 7 weeks if she is having ongoing symptoms we can proceed with a thoracic MRI scan.  Follow-Up Instructions: Return in about 7 weeks (around 09/04/2022).   Orders:  No orders of the defined types were placed in this encounter.  No orders of the defined types were placed in this encounter.     Procedures: No procedures performed   Clinical Data: No additional findings.   Subjective: Chief Complaint  Patient presents with   Middle Back - Pain, Follow-up    HPI 41 year old female returns with ongoing problems with thoracic pain at around T6 or T7.  She describes it as a burning pain.  Previous cervical disc arthroplasty at C6-7 done on 11/13/2021.  We discussed therapy but she had not been called.  She continues to have take the gabapentin 300 mg 3 times daily and states if it was not for that she would be having the lay down most of the time and not get her normal activities of life completed.  She denies upper extremity numbness or tingling or weakness in her arms and hands that she had prior to her disc arthroplasty.  Previous thoracic x-rays demonstrated some disc space narrowing and anterior spurring at T6-7 and no other level changes on x-ray.  Review of Systems all the systems are noncontributory.   Objective: Vital Signs: BP 111/71   Pulse 82   Ht 5\' 6"  (1.676 m)   Wt 160 lb (72.6 kg)   BMI 25.82 kg/m   Physical Exam Constitutional:      Appearance: She is well-developed.  HENT:     Head: Normocephalic.      Right Ear: External ear normal.     Left Ear: External ear normal. There is no impacted cerumen.  Eyes:     Pupils: Pupils are equal, round, and reactive to light.  Neck:     Thyroid: No thyromegaly.     Trachea: No tracheal deviation.  Cardiovascular:     Rate and Rhythm: Normal rate.  Pulmonary:     Effort: Pulmonary effort is normal.  Abdominal:     Palpations: Abdomen is soft.  Musculoskeletal:     Cervical back: No rigidity.  Skin:    General: Skin is warm and dry.  Neurological:     Mental Status: She is alert and oriented to person, place, and time.  Psychiatric:        Behavior: Behavior normal.     Ortho Exam tenderness at the mid thoracic region and paraspinals midline not more on 1 side than the other.  Normal heel-toe gait no lower extremity weakness no clonus.  Specialty Comments:  No specialty comments available.  Imaging: No results found.   PMFS History: Patient Active Problem List   Diagnosis Date Noted   S/P cervical disc replacement 07/17/2022   Pain in thoracic spine 07/17/2022   HNP (herniated  nucleus pulposus), cervical 11/13/2021   Past Medical History:  Diagnosis Date   Anemia    hx of iron-deficiency anemia, pt states "it comes and goes"   Anxiety    Depression    Pre-diabetes     Family History  Problem Relation Age of Onset   Breast cancer Paternal Aunt    Breast cancer Paternal Grandmother     Past Surgical History:  Procedure Laterality Date   CERVICAL DISC ARTHROPLASTY N/A 11/13/2021   Procedure: C6-7 CERVICAL ANTERIOR DISC ARTHROPLASTY;  Surgeon: Eldred Manges, MD;  Location: [facility] OR;  Service: Orthopedics;  Laterality: N/A;   EYE SURGERY Left 1987   to fix lazy eye   PATELLAR TENDON REPAIR Left 1997   THYROIDECTOMY, PARTIAL  2003   Social History   Occupational History   Not on file  Tobacco Use   Smoking status: Never   Smokeless tobacco: Never  Vaping Use   Vaping Use: Never used  Substance and Sexual Activity    Alcohol use: Yes    Comment: maybe 1 mixed drink once a month   Drug use: Never   Sexual activity: Not on file

## 2022-08-07 ENCOUNTER — Encounter: Payer: Self-pay | Admitting: Physical Therapy

## 2022-08-07 ENCOUNTER — Ambulatory Visit: Payer: BC Managed Care – PPO | Admitting: Physical Therapy

## 2022-08-07 DIAGNOSIS — M6281 Muscle weakness (generalized): Secondary | ICD-10-CM

## 2022-08-07 DIAGNOSIS — R293 Abnormal posture: Secondary | ICD-10-CM

## 2022-08-07 DIAGNOSIS — M546 Pain in thoracic spine: Secondary | ICD-10-CM

## 2022-08-07 NOTE — Therapy (Signed)
OUTPATIENT PHYSICAL THERAPY THORACOLUMBAR EVALUATION   Patient Name: Cheryl Juarez MRN: [id number] DOB:[birth year], 41 y.o., female Today's Date: 08/07/2022  END OF SESSION:  PT End of Session - 08/07/22 1611     Visit Number 1    Number of Visits 9    Date for PT Re-Evaluation 10/05/22    PT Start Time 1515    PT Stop Time 1556    PT Time Calculation (min) 41 min    Activity Tolerance Patient tolerated treatment well    Behavior During Therapy WFL for tasks assessed/performed             Past Medical History:  Diagnosis Date   Anemia    hx of iron-deficiency anemia, pt states "it comes and goes"   Anxiety    Depression    Pre-diabetes    Past Surgical History:  Procedure Laterality Date   CERVICAL DISC ARTHROPLASTY N/A 11/13/2021   Procedure: C6-7 CERVICAL ANTERIOR DISC ARTHROPLASTY;  Surgeon: Eldred Manges, MD;  Location: [facility] OR;  Service: Orthopedics;  Laterality: N/A;   EYE SURGERY Left 1987   to fix lazy eye   PATELLAR TENDON REPAIR Left 1997   THYROIDECTOMY, PARTIAL  2003   Patient Active Problem List   Diagnosis Date Noted   S/P cervical disc replacement 07/17/2022   Pain in thoracic spine 07/17/2022   HNP (herniated nucleus pulposus), cervical 11/13/2021    PCP: Jarrett Soho, PA-C   REFERRING PROVIDER: Eldred Manges, MD   REFERRING DIAG: [phone] (ICD-10-CM) - S/P cervical disc replacement M54.6 (ICD-10-CM) - Pain in thoracic spine  Rationale for Evaluation and Treatment: Rehabilitation  THERAPY DIAG:  Pain in thoracic spine  Abnormal posture  Muscle weakness (generalized)  ONSET DATE: years ago  SUBJECTIVE:                                                                                                                                                                                           SUBJECTIVE STATEMENT: Pt stating her pain began about 3 years ago. Pt stating she had surgery in March 2023 and her pain meds had been  covering up her thoracic spine pain. Pt stating she had therapy in the past for her UE. Pt stating she has tried going to chiropractor and her pain increased.   PERTINENT HISTORY:  Anemia, anxiety, depression, pre-diabetes, cervical disc arthroplasty, eye surgery, patellar tendon repair, thyroidectomy-partial  PAIN:  NPRS scale: 2/10 Pain location: mid thoracic spine around T6-7 Pain description: burning pain Aggravating factors: trying to sleep at night Relieving factors: meds, gabapentin  PRECAUTIONS: None  WEIGHT BEARING RESTRICTIONS: No  FALLS:  Has patient fallen in last 6 months? 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   PLOF: Independent  PATIENT GOALS: Stop hurting     OBJECTIVE:   DIAGNOSTIC FINDINGS:  Previous thoracic x-rays demonstrated some disc space narrowing and anterior spurring at T6-7 and no other level changes on x-ray.  PATIENT SURVEYS:  08/07/22: FOTO eval:    59%    COGNITION: Overall cognitive status: WFL normal      SENSATION: WFL    POSTURE: rounded shoulders and forward head  PALPATION: 08/07/22: TTP:   LUMBAR ROM:   AROM Eval 08/07/22  Flexion 86  Extension 42  Right lateral flexion 36  Left lateral flexion 42  Right rotation WFL  Left rotation WFL (mild discomfort and limitation compared to Rt)   (Blank rows = not tested)     LOWER EXTREMITY MMT:    MMT Right 08/07/22 Left 08/07/22  Hip flexion 5/5 5/5  Hip extension    Hip abduction 5/5 5/5  Hip adduction 5/5 5/5  Hip internal rotation    Hip external rotation    Knee flexion 5/5 5/5  Knee extension 5/5 5/5  Ankle dorsiflexion    Ankle plantarflexion    Ankle inversion    Ankle eversion      (Blank rows = not tested)      GAIT: Distance walked: 30 feet  Assistive device utilized: None Level of assistance: Complete Independence Comments: WFL  TODAY'S TREATMENT:                                                                                                                              DATE:  08/07/22:   Therex:    HEP instruction/performance c cues for techniques, handout provided.  Trial set performed of each for comprehension and symptom assessment.  See below for exercise list  PATIENT EDUCATION:  Education details: HEP, POC Person educated: Patient Education method: Explanation, Demonstration, Verbal cues, and Handouts Education comprehension: verbalized understanding, returned demonstration, and verbal cues required  HOME EXERCISE PROGRAM: Access Code: [email or web address] URL: [email or web address] Date: 08/07/2022 Prepared by: Kearney Hard  Exercises - Doorway Rhomboid Stretch  - 2 x daily - 7 x weekly - 3 reps - 20 seconds hold - Child's Pose with Sidebending  - 2 x daily - 7 x weekly - 3 reps - 20 seconds hold - Standing Shoulder External Rotation with Resistance  - 2 x daily - 7 x weekly - 2 sets - 10 reps - 3 seconds hold - Standing Row with Anchored Resistance  - 2 x daily - 7 x weekly - 2 sets - 10 reps - 3 seconds hold  ASSESSMENT:  CLINICAL IMPRESSION:  Patient is a 41 y.o. who comes to clinic with complaints of thoracic back pain with mobility, strength and movement coordination deficits that impair their ability to perform usual daily and recreational functional activities without increase difficulty/symptoms at this time. Pt was edu in self soft tissue massage/mobilization using a tennis ball for home use. Patient to benefit from skilled PT services to address impairments and limitations to improve to previous level of function without restriction secondary to condition.   OBJECTIVE IMPAIRMENTS: pain.movement restrictions   ACTIVITY  LIMITATIONS: sitting, bed mobility, and reach over head  PARTICIPATION LIMITATIONS: occupation  PERSONAL FACTORS:  see pertinent history above  are also affecting patient's functional outcome.   REHAB POTENTIAL: Good  CLINICAL DECISION MAKING: Stable/uncomplicated  EVALUATION COMPLEXITY: Low   GOALS: Goals reviewed with patient? Yes  SHORT TERM GOALS: (target date for Short term goals are 3 weeks 08/31/22)  1. Patient will demonstrate independent use of home exercise program to maintain progress from in clinic treatments.  Goal status: New  LONG TERM GOALS: (target dates for all long term goals are 10 weeks  10/02/2022)   1. Patient will demonstrate/report pain at worst less than or equal to 2/10 to facilitate minimal limitation in daily activity secondary to pain symptoms.  Goal status: New   2. Patient will demonstrate independent use of home exercise program to facilitate ability to maintain/progress functional gains from skilled physical therapy services.  Goal status: New   3. Patient will demonstrate FOTO outcome > or = 64 % to indicate reduced disability due to condition.  Goal status: New   4. Pt will be able to report sitting at her desk for work for >/= 1 hour with pain </= 2/10 in her thoracic spine.  Goal status: New   5.  Pt will be able to lift 5 pounds overhead with no pain reported using correct body mechanics.  Goal status: New    PLAN:  PT FREQUENCY: 1-2x/week  PT DURATION: 8 weeks  PLANNED INTERVENTIONS: Therapeutic exercises, Therapeutic activity, Neuro Muscular re-education, Balance training, Gait training, Patient/Family education, Joint mobilization, Stair training, DME instructions, Dry Needling, Electrical stimulation, Cryotherapy, vasopneumatic device, Moist heat, Taping, Traction Ultrasound, Ionotophoresis 4mg /ml Dexamethasone, and Manual therapy.  All included unless contraindicated  PLAN FOR NEXT SESSION: Measure shoulder ROM Review HEP  knowledge/results, thoracic mobs, scapular mobility, try TENS in order for pt to see if she needs to purchase one for home use.      Oretha Caprice, PT, MPT 08/07/2022, 4:13 PM

## 2022-08-13 ENCOUNTER — Other Ambulatory Visit: Payer: Self-pay | Admitting: Orthopaedic Surgery

## 2022-08-14 ENCOUNTER — Encounter: Payer: Self-pay | Admitting: Physical Therapy

## 2022-08-14 ENCOUNTER — Ambulatory Visit (INDEPENDENT_AMBULATORY_CARE_PROVIDER_SITE_OTHER): Payer: BC Managed Care – PPO | Admitting: Physical Therapy

## 2022-08-14 DIAGNOSIS — M6281 Muscle weakness (generalized): Secondary | ICD-10-CM

## 2022-08-14 DIAGNOSIS — R293 Abnormal posture: Secondary | ICD-10-CM

## 2022-08-14 DIAGNOSIS — M546 Pain in thoracic spine: Secondary | ICD-10-CM | POA: Diagnosis not present

## 2022-08-14 NOTE — Therapy (Signed)
OUTPATIENT PHYSICAL THERAPY TREATMENT NOTE   Patient Name: Cheryl Juarez MRN: [id number] DOB:1981/02/21, 41 y.o., female Today's Date: 08/14/2022  PCP: Marda Stalker, PA-C  REFERRING PROVIDER: Marybelle Killings, MD   END OF SESSION:   PT End of Session - 08/14/22 1556     Visit Number 2    Number of Visits 9    Date for PT Re-Evaluation 10/05/22    PT Start Time 1520    PT Stop Time 1600    PT Time Calculation (min) 40 min    Activity Tolerance Patient tolerated treatment well    Behavior During Therapy WFL for tasks assessed/performed             Past Medical History:  Diagnosis Date   Anemia    hx of iron-deficiency anemia, pt states "it comes and goes"   Anxiety    Depression    Pre-diabetes    Past Surgical History:  Procedure Laterality Date   CERVICAL DISC ARTHROPLASTY N/A 11/13/2021   Procedure: C6-7 CERVICAL ANTERIOR DISC ARTHROPLASTY;  Surgeon: Marybelle Killings, MD;  Location: [facility];  Service: Orthopedics;  Laterality: N/A;   EYE SURGERY Left 1987   to fix lazy eye   PATELLAR TENDON REPAIR Left 1997   THYROIDECTOMY, PARTIAL  2003   Patient Active Problem List   Diagnosis Date Noted   S/P cervical disc replacement 07/17/2022   Pain in thoracic spine 07/17/2022   HNP (herniated nucleus pulposus), cervical 11/13/2021    REFERRING DIAG: Z98.890 (ICD-10-CM) - S/P cervical disc replacement M54.6 (ICD-10-CM) - Pain in thoracic spine  THERAPY DIAG:  Pain in thoracic spine  Abnormal posture  Muscle weakness (generalized)  Rationale for Evaluation and Treatment Rehabilitation  PERTINENT HISTORY: Anemia, anxiety, depression, pre-diabetes, cervical disc arthroplasty, eye surgery, patellar tendon repair, thyroidectomy-partial   PRECAUTIONS: none  SUBJECTIVE:                                                                                                                                                                                      SUBJECTIVE  STATEMENT:   Pt arriving today reporting inconsistency with her HEP. Pt stating pain level of 2/10 in mid back around bra line.   PAIN:  Are you having pain? 2/10 in mid  thoracic spine    OBJECTIVE: (objective measures completed at initial evaluation unless otherwise dated)  DIAGNOSTIC FINDINGS:  Previous thoracic x-rays demonstrated some disc space narrowing and anterior spurring at T6-7 and no other level changes on x-ray.   PATIENT SURVEYS:  08/07/22: FOTO eval:    59%       COGNITION: Overall cognitive status: WFL normal  SENSATION: WFL       POSTURE: rounded shoulders and forward head   PALPATION: 08/07/22: TTP:    LUMBAR ROM:    AROM Eval 08/07/22  Flexion 86  Extension 42  Right lateral flexion 36  Left lateral flexion 42  Right rotation WFL  Left rotation WFL (mild discomfort and limitation compared to Rt)   (Blank rows = not tested)         LOWER EXTREMITY MMT:     MMT Right 08/07/22 Left 08/07/22  Hip flexion 5/5 5/5  Hip extension      Hip abduction 5/5 5/5  Hip adduction 5/5 5/5  Hip internal rotation      Hip external rotation      Knee flexion 5/5 5/5  Knee extension 5/5 5/5  Ankle dorsiflexion      Ankle plantarflexion      Ankle inversion      Ankle eversion       (Blank rows = not tested)           GAIT: Distance walked: 30 feet  Assistive device utilized: None Level of assistance: Complete Independence Comments: WFL   TODAY'S TREATMENT:                                                                                                                              DATE:  08/14/22:  TherEx: Nustep: level 5 x 5 minutes Child's pose holding 1 minute Child's pose rotation to each side x 4 holding 10 sec Rhomboid door stretch : x 2 holding 30 sec Supine PPT x 10 holding 5 sec Bridge: c PPT performed first and then lifting for 5 sec x 10 Sitting on green physio-ball while performing PPT x  10 Quadraped: bird dogs: x 10 with cues for abdominal activation to keep back in neutral position Supine on 1/2 bolster placed vertical along spine: abdominal activation c opposite arm/leg,  Thoracic extension over 1/2 bolster holding 1 minute Manual Thoracic grade 3-4 PA mobs: T3-T8  08/07/22:   Therex:    HEP instruction/performance c cues for techniques, handout provided.  Trial set performed of each for comprehension and symptom assessment.  See below for exercise list   PATIENT EDUCATION:  Education details: HEP, POC Person educated: Patient Education method: Explanation, Demonstration, Verbal cues, and Handouts Education comprehension: verbalized understanding, returned demonstration, and verbal cues required   HOME EXERCISE PROGRAM: Access Code: [email or web address] URL: [email or web address] Date: 08/07/2022 Prepared by: Narda Amber  Access Code: [email or web address] URL: [email or web address] Date: 08/14/2022 Prepared by: Narda Amber  Exercises - Child's Pose with Sidebending  - 2 x daily - 7 x weekly - 3 reps - 20 seconds hold - Standing Shoulder External Rotation with Resistance  - 2 x daily - 7 x weekly - 2 sets - 10 reps - 3 seconds hold - Standing Row with Anchored Resistance  - 2 x daily - 7 x weekly - 2 sets - 10 reps -  3 seconds hold - Open Book Chest Stretch on Towel Roll  - 1 x daily - 7 x weekly - Thoracic Extension Mobilization with Noodle  - 1 x daily - 7 x weekly - Standing Lean Away Doorway Stretch  - 1 x daily - 7 x weekly - 3 reps - 15-20 sec hold Exercises - Doorway Rhomboid Stretch  - 2 x daily - 7 x weekly - 3 reps - 20 seconds hold - Child's Pose with Sidebending  - 2 x daily - 7 x weekly - 3 reps - 20 seconds hold - Standing Shoulder External Rotation with Resistance  - 2 x daily - 7 x weekly - 2 sets - 10 reps - 3 seconds hold - Standing Row with Anchored Resistance  - 2 x daily - 7 x weekly - 2 sets - 10 reps - 3 seconds hold    ASSESSMENT:   CLINICAL IMPRESSION: Treatment focused on review of pt's initial HEP and progressing core strengthening along with thoracic mobility. Also work performed on pelvis dissociation and lumbar mobility. Continue skilled PT to maximize pt's function.    OBJECTIVE IMPAIRMENTS: pain.movement restrictions    ACTIVITY LIMITATIONS: sitting, bed mobility, and reach over head   PARTICIPATION LIMITATIONS: occupation   PERSONAL FACTORS:  see pertinent history above  are also affecting patient's functional outcome.    REHAB POTENTIAL: Good   CLINICAL DECISION MAKING: Stable/uncomplicated   EVALUATION COMPLEXITY: Low     GOALS: Goals reviewed with patient? Yes   SHORT TERM GOALS: (target date for Short term goals are 3 weeks 08/31/22)   1. Patient will demonstrate independent use of home exercise program to maintain progress from in clinic treatments.   Goal status: New   LONG TERM GOALS: (target dates for all long term goals are 10 weeks  10/02/2022)   1. Patient will demonstrate/report pain at worst less than or equal to 2/10 to facilitate minimal limitation in daily activity secondary to pain symptoms.   Goal status: New   2. Patient will demonstrate independent use of home exercise program to facilitate ability to maintain/progress functional gains from skilled physical therapy services.   Goal status: New   3. Patient will demonstrate FOTO outcome > or = 64 % to indicate reduced disability due to condition.   Goal status: New   4. Pt will be able to report sitting at her desk for work for >/= 1 hour with pain </= 2/10 in her thoracic spine.   Goal status: New   5.  Pt will be able to lift 5 pounds overhead with no pain reported using correct body mechanics.  Goal status: New       PLAN:   PT FREQUENCY: 1-2x/week   PT DURATION: 8 weeks   PLANNED INTERVENTIONS: Therapeutic exercises, Therapeutic activity, Neuro Muscular re-education, Balance training, Gait  training, Patient/Family education, Joint mobilization, Stair training, DME instructions, Dry Needling, Electrical stimulation, Cryotherapy, vasopneumatic device, Moist heat, Taping, Traction Ultrasound, Ionotophoresis 4mg /ml Dexamethasone, and Manual therapy.  All included unless contraindicated   PLAN FOR NEXT SESSION: Measure shoulder ROM Review HEP knowledge/results, thoracic mobs, scapular mobility, try TENS in order for pt to see if she needs to purchase one for home use.     Oretha Caprice, PT 08/14/2022, 4:25 PM

## 2022-08-21 ENCOUNTER — Ambulatory Visit (INDEPENDENT_AMBULATORY_CARE_PROVIDER_SITE_OTHER): Payer: BC Managed Care – PPO | Admitting: Physical Therapy

## 2022-08-21 ENCOUNTER — Encounter: Payer: Self-pay | Admitting: Physical Therapy

## 2022-08-21 DIAGNOSIS — M6281 Muscle weakness (generalized): Secondary | ICD-10-CM | POA: Diagnosis not present

## 2022-08-21 DIAGNOSIS — M546 Pain in thoracic spine: Secondary | ICD-10-CM

## 2022-08-21 DIAGNOSIS — R293 Abnormal posture: Secondary | ICD-10-CM

## 2022-08-21 NOTE — Therapy (Signed)
OUTPATIENT PHYSICAL THERAPY TREATMENT NOTE   Patient Name: Ameia Morency MRN: [id number] DOB:Jan 13, 1981, 41 y.o., female Today's Date: 08/21/2022  PCP: Marda Stalker, PA-C  REFERRING PROVIDER: Marybelle Killings, MD   END OF SESSION:   PT End of Session - 08/21/22 1517     Visit Number 3    Number of Visits 9    Date for PT Re-Evaluation 10/05/22    PT Start Time 1430    PT Stop Time 1515    PT Time Calculation (min) 45 min    Activity Tolerance Patient tolerated treatment well    Behavior During Therapy WFL for tasks assessed/performed             Past Medical History:  Diagnosis Date   Anemia    hx of iron-deficiency anemia, pt states "it comes and goes"   Anxiety    Depression    Pre-diabetes    Past Surgical History:  Procedure Laterality Date   CERVICAL DISC ARTHROPLASTY N/A 11/13/2021   Procedure: C6-7 CERVICAL ANTERIOR DISC ARTHROPLASTY;  Surgeon: Marybelle Killings, MD;  Location: [facility];  Service: Orthopedics;  Laterality: N/A;   EYE SURGERY Left 1987   to fix lazy eye   PATELLAR TENDON REPAIR Left 1997   THYROIDECTOMY, PARTIAL  2003   Patient Active Problem List   Diagnosis Date Noted   S/P cervical disc replacement 07/17/2022   Pain in thoracic spine 07/17/2022   HNP (herniated nucleus pulposus), cervical 11/13/2021    REFERRING DIAG: Z98.890 (ICD-10-CM) - S/P cervical disc replacement M54.6 (ICD-10-CM) - Pain in thoracic spine  THERAPY DIAG:  Pain in thoracic spine  Abnormal posture  Muscle weakness (generalized)  Rationale for Evaluation and Treatment Rehabilitation  PERTINENT HISTORY: Anemia, anxiety, depression, pre-diabetes, cervical disc arthroplasty, eye surgery, patellar tendon repair, thyroidectomy-partial   PRECAUTIONS: none  SUBJECTIVE:                                                                                                                                                                                      SUBJECTIVE  STATEMENT:   Pt reporting burning sensation right above her bra line in upper-mid thoracic spine.   PAIN:  Are you having pain? 3-4/10 in mid  thoracic spine    OBJECTIVE: (objective measures completed at initial evaluation unless otherwise dated)  DIAGNOSTIC FINDINGS:  Previous thoracic x-rays demonstrated some disc space narrowing and anterior spurring at T6-7 and no other level changes on x-ray.   PATIENT SURVEYS:  08/07/22: FOTO eval:    59%       COGNITION: Overall cognitive status: WFL normal  SENSATION: WFL       POSTURE: rounded shoulders and forward head   PALPATION: 08/07/22: TTP:    LUMBAR ROM:    AROM Eval 08/07/22  Flexion 86  Extension 42  Right lateral flexion 36  Left lateral flexion 42  Right rotation WFL  Left rotation WFL (mild discomfort and limitation compared to Rt)   (Blank rows = not tested)         LOWER EXTREMITY MMT:     MMT Right 08/07/22 Left 08/07/22  Hip flexion 5/5 5/5  Hip extension      Hip abduction 5/5 5/5  Hip adduction 5/5 5/5  Hip internal rotation      Hip external rotation      Knee flexion 5/5 5/5  Knee extension 5/5 5/5  Ankle dorsiflexion      Ankle plantarflexion      Ankle inversion      Ankle eversion       (Blank rows = not tested)           GAIT: Distance walked: 30 feet  Assistive device utilized: None Level of assistance: Complete Independence Comments: WFL   TODAY'S TREATMENT:                                                                                                                              DATE:  08/21/22:  TherEx: Rows: Level 4 band x 20 holding 3 sec Shoulder extension: x 15 holding 3 sec Level 3 band Quadraped: threading the needle (thoracic mobility): x 10 each direction holding 3-5 seconds Quadraped thoracic/trunk rotation with arms abd x 10 each side Thoracic mobilization on foam roller 3 x 5  Manual Thoracic grade 3-4 PA mobs:  T3-T8 Modalities:  Moist heat x 5 minutes to thoracic spine   08/14/22:  TherEx: Nustep: level 5 x 5 minutes Child's pose holding 1 minute Child's pose rotation to each side x 4 holding 10 sec Rhomboid door stretch : x 2 holding 30 sec Supine PPT x 10 holding 5 sec Bridge: c PPT performed first and then lifting for 5 sec x 10 Sitting on green physio-ball while performing PPT x 10 Quadraped: bird dogs: x 10 with cues for abdominal activation to keep back in neutral position Supine on 1/2 bolster placed vertical along spine: abdominal activation c opposite arm/leg,  Thoracic extension over 1/2 bolster holding 1 minute Manual Thoracic grade 3-4 PA mobs: T3-T8  08/07/22:   Therex:    HEP instruction/performance c cues for techniques, handout provided.  Trial set performed of each for comprehension and symptom assessment.  See below for exercise list   PATIENT EDUCATION:  Education details: HEP, POC Person educated: Patient Education method: Explanation, Demonstration, Verbal cues, and Handouts Education comprehension: verbalized understanding, returned demonstration, and verbal cues required   HOME EXERCISE PROGRAM: Access Code: [email or web address] URL: [email or web address] Date: 08/07/2022 Prepared by: Kearney Hard  Access Code: [email or web address] URL: [email or web address] Date: 08/14/2022 Prepared by:  Kearney Hard  Exercises - Child's Pose with Sidebending  - 2 x daily - 7 x weekly - 3 reps - 20 seconds hold - Standing Shoulder External Rotation with Resistance  - 2 x daily - 7 x weekly - 2 sets - 10 reps - 3 seconds hold - Standing Row with Anchored Resistance  - 2 x daily - 7 x weekly - 2 sets - 10 reps - 3 seconds hold - Open Book Chest Stretch on Towel Roll  - 1 x daily - 7 x weekly - Thoracic Extension Mobilization with Noodle  - 1 x daily - 7 x weekly - Standing Lean Away Doorway Stretch  - 1 x daily - 7 x weekly - 3 reps - 15-20 sec hold Exercises -  Doorway Rhomboid Stretch  - 2 x daily - 7 x weekly - 3 reps - 20 seconds hold - Child's Pose with Sidebending  - 2 x daily - 7 x weekly - 3 reps - 20 seconds hold - Standing Shoulder External Rotation with Resistance  - 2 x daily - 7 x weekly - 2 sets - 10 reps - 3 seconds hold - Standing Row with Anchored Resistance  - 2 x daily - 7 x weekly - 2 sets - 10 reps - 3 seconds hold   ASSESSMENT:   CLINICAL IMPRESSION: Pt arriving to therapy reporting mild mid thoracic pain. Pt stating her neck is feeling better. Pt tolerating more thoracic mobility exercises well and continues to benefit form manual therapy and mobs. Pt stating she has used a TENs unit before and discussed purchasing one for use while she is working. Continue skilled PT to maximize pt's function.    OBJECTIVE IMPAIRMENTS: pain.movement restrictions    ACTIVITY LIMITATIONS: sitting, bed mobility, and reach over head   PARTICIPATION LIMITATIONS: occupation   PERSONAL FACTORS:  see pertinent history above  are also affecting patient's functional outcome.    REHAB POTENTIAL: Good   CLINICAL DECISION MAKING: Stable/uncomplicated   EVALUATION COMPLEXITY: Low     GOALS: Goals reviewed with patient? Yes   SHORT TERM GOALS: (target date for Short term goals are 3 weeks 08/31/22)   1. Patient will demonstrate independent use of home exercise program to maintain progress from in clinic treatments.   Goal status: MET 08/21/22   LONG TERM GOALS: (target dates for all long term goals are 10 weeks  10/02/2022)   1. Patient will demonstrate/report pain at worst less than or equal to 2/10 to facilitate minimal limitation in daily activity secondary to pain symptoms.   Goal status: New   2. Patient will demonstrate independent use of home exercise program to facilitate ability to maintain/progress functional gains from skilled physical therapy services.   Goal status: New   3. Patient will demonstrate FOTO outcome > or = 64 % to  indicate reduced disability due to condition.   Goal status: New   4. Pt will be able to report sitting at her desk for work for >/= 1 hour with pain </= 2/10 in her thoracic spine.   Goal status: New   5.  Pt will be able to lift 5 pounds overhead with no pain reported using correct body mechanics.  Goal status: New       PLAN:   PT FREQUENCY: 1-2x/week   PT DURATION: 8 weeks   PLANNED INTERVENTIONS: Therapeutic exercises, Therapeutic activity, Neuro Muscular re-education, Balance training, Gait training, Patient/Family education, Joint mobilization, Stair training, DME instructions, Dry Needling, Electrical  stimulation, Cryotherapy, vasopneumatic device, Moist heat, Taping, Traction Ultrasound, Ionotophoresis 49m/ml Dexamethasone, and Manual therapy.  All included unless contraindicated   PLAN FOR NEXT SESSION: thoracic mobs, scapular mobility, core strengthening    JOretha Caprice PT, MPT 08/21/2022, 3:20 PM

## 2022-08-29 ENCOUNTER — Encounter: Payer: Self-pay | Admitting: Physical Therapy

## 2022-08-29 ENCOUNTER — Ambulatory Visit: Payer: BC Managed Care – PPO | Admitting: Physical Therapy

## 2022-08-29 DIAGNOSIS — M6281 Muscle weakness (generalized): Secondary | ICD-10-CM

## 2022-08-29 DIAGNOSIS — M546 Pain in thoracic spine: Secondary | ICD-10-CM

## 2022-08-29 DIAGNOSIS — R293 Abnormal posture: Secondary | ICD-10-CM

## 2022-08-29 NOTE — Therapy (Signed)
OUTPATIENT PHYSICAL THERAPY TREATMENT NOTE   Patient Name: Cheryl Juarez MRN: [id number] DOB:1980-10-11, 41 y.o., female Today's Date: 08/29/2022  PCP: Marda Stalker, PA-C  REFERRING PROVIDER: Marybelle Killings, MD   END OF SESSION:   PT End of Session - 08/29/22 1513     Visit Number 4    Number of Visits 9    Date for PT Re-Evaluation 10/05/22    PT Start Time 1513    PT Stop Time 1551    PT Time Calculation (min) 38 min    Activity Tolerance Patient tolerated treatment well    Behavior During Therapy WFL for tasks assessed/performed              Past Medical History:  Diagnosis Date   Anemia    hx of iron-deficiency anemia, pt states "it comes and goes"   Anxiety    Depression    Pre-diabetes    Past Surgical History:  Procedure Laterality Date   CERVICAL DISC ARTHROPLASTY N/A 11/13/2021   Procedure: C6-7 CERVICAL ANTERIOR DISC ARTHROPLASTY;  Surgeon: Marybelle Killings, MD;  Location: [facility];  Service: Orthopedics;  Laterality: N/A;   EYE SURGERY Left 1987   to fix lazy eye   PATELLAR TENDON REPAIR Left 1997   THYROIDECTOMY, PARTIAL  2003   Patient Active Problem List   Diagnosis Date Noted   S/P cervical disc replacement 07/17/2022   Pain in thoracic spine 07/17/2022   HNP (herniated nucleus pulposus), cervical 11/13/2021    REFERRING DIAG: Z98.890 (ICD-10-CM) - S/P cervical disc replacement M54.6 (ICD-10-CM) - Pain in thoracic spine  THERAPY DIAG:  Pain in thoracic spine  Abnormal posture  Muscle weakness (generalized)  Rationale for Evaluation and Treatment Rehabilitation  PERTINENT HISTORY: Anemia, anxiety, depression, pre-diabetes, cervical disc arthroplasty, eye surgery, patellar tendon repair, thyroidectomy-partial   PRECAUTIONS: none  SUBJECTIVE:                                                                                                                                                                                       SUBJECTIVE STATEMENT:   Feeling a little better; lying on pool noodle has been helpful.    PAIN:  Are you having pain? 3/10 in mid  thoracic spine    OBJECTIVE: (objective measures completed at initial evaluation unless otherwise dated)  DIAGNOSTIC FINDINGS:  Previous thoracic x-rays demonstrated some disc space narrowing and anterior spurring at T6-7 and no other level changes on x-ray.   PATIENT SURVEYS:  08/07/22: FOTO eval:    59%       COGNITION: Overall cognitive status: WFL normal  SENSATION: WFL       POSTURE: rounded shoulders and forward head   PALPATION: 08/07/22: TTP:    LUMBAR ROM:    AROM Eval 08/07/22  Flexion 86  Extension 42  Right lateral flexion 36  Left lateral flexion 42  Right rotation WFL  Left rotation WFL (mild discomfort and limitation compared to Rt)   (Blank rows = not tested)         LOWER EXTREMITY MMT:     MMT Right 08/07/22 Left 08/07/22  Hip flexion 5/5 5/5  Hip extension      Hip abduction 5/5 5/5  Hip adduction 5/5 5/5  Hip internal rotation      Hip external rotation      Knee flexion 5/5 5/5  Knee extension 5/5 5/5  Ankle dorsiflexion      Ankle plantarflexion      Ankle inversion      Ankle eversion       (Blank rows = not tested)           GAIT: Distance walked: 30 feet  Assistive device utilized: None Level of assistance: Complete Independence Comments: WFL   TODAY'S TREATMENT:  DATE:  08/29/22 TherEx: UBE L2 x 6 min (3' each direction) Standing ER/scapular retraction 2x10; L3 band Prone UE/LE alternating x 10 reps bil with 3 sec hold Childs pose stretch x 10-15 sec Quadraped: threading the needle (thoracic mobility): x 5 each direction holding 3-5 seconds Cat/cow x 10 reps Rows on BATCA 2x10; 15# Lat Pull Downs on BATCA 2x10; 15# Seated thoracic extension 10 x 10 sec hold   08/21/22:  TherEx: Rows: Level 4 band x 20 holding 3 sec Shoulder extension: x  15 holding 3 sec Level 3 band Quadraped: threading the needle (thoracic mobility): x 10 each direction holding 3-5 seconds Quadraped thoracic/trunk rotation with arms abd x 10 each side Thoracic mobilization on foam roller 3 x 5  Manual Thoracic grade 3-4 PA mobs: T3-T8  Modalities:  Moist heat x 5 minutes to thoracic spine   08/14/22:  TherEx: Nustep: level 5 x 5 minutes Child's pose holding 1 minute Child's pose rotation to each side x 4 holding 10 sec Rhomboid door stretch : x 2 holding 30 sec Supine PPT x 10 holding 5 sec Bridge: c PPT performed first and then lifting for 5 sec x 10 Sitting on green physio-ball while performing PPT x 10 Quadraped: bird dogs: x 10 with cues for abdominal activation to keep back in neutral position Supine on 1/2 bolster placed vertical along spine: abdominal activation c opposite arm/leg,  Thoracic extension over 1/2 bolster holding 1 minute Manual Thoracic grade 3-4 PA mobs: T3-T8  08/07/22:   Therex:    HEP instruction/performance c cues for techniques, handout provided.  Trial set performed of each for comprehension and symptom assessment.  See below for exercise list   PATIENT EDUCATION:  Education details: HEP, POC Person educated: Patient Education method: Explanation, Demonstration, Verbal cues, and Handouts Education comprehension: verbalized understanding, returned demonstration, and verbal cues required   HOME EXERCISE PROGRAM: Access Code: [email or web address] URL: [email or web address] Date: 08/07/2022 Prepared by: Kearney Hard  Access Code: [email or web address] URL: [email or web address] Date: 08/14/2022 Prepared by: Kearney Hard  Exercises - Child's Pose with Sidebending  - 2 x daily - 7 x weekly - 3 reps - 20 seconds hold - Standing Shoulder External Rotation with Resistance  - 2 x daily - 7 x weekly - 2 sets - 10 reps - 3 seconds hold -  Standing Row with Anchored Resistance  - 2 x daily - 7 x weekly - 2  sets - 10 reps - 3 seconds hold - Open Book Chest Stretch on Towel Roll  - 1 x daily - 7 x weekly - Thoracic Extension Mobilization with Noodle  - 1 x daily - 7 x weekly - Standing Lean Away Doorway Stretch  - 1 x daily - 7 x weekly - 3 reps - 15-20 sec hold Exercises - Doorway Rhomboid Stretch  - 2 x daily - 7 x weekly - 3 reps - 20 seconds hold - Child's Pose with Sidebending  - 2 x daily - 7 x weekly - 3 reps - 20 seconds hold - Standing Shoulder External Rotation with Resistance  - 2 x daily - 7 x weekly - 2 sets - 10 reps - 3 seconds hold - Standing Row with Anchored Resistance  - 2 x daily - 7 x weekly - 2 sets - 10 reps - 3 seconds hold   ASSESSMENT:   CLINICAL IMPRESSION: Pt does have some components of hypermobility so recommend focus on stability and strengthening with light stretching as needed.  Will continue to benefit from PT to maximize function.    OBJECTIVE IMPAIRMENTS: pain.movement restrictions    ACTIVITY LIMITATIONS: sitting, bed mobility, and reach over head   PARTICIPATION LIMITATIONS: occupation   PERSONAL FACTORS:  see pertinent history above  are also affecting patient's functional outcome.    REHAB POTENTIAL: Good   CLINICAL DECISION MAKING: Stable/uncomplicated   EVALUATION COMPLEXITY: Low     GOALS: Goals reviewed with patient? Yes   SHORT TERM GOALS: (target date for Short term goals are 3 weeks 08/31/22)   1. Patient will demonstrate independent use of home exercise program to maintain progress from in clinic treatments.   Goal status: MET 08/21/22   LONG TERM GOALS: (target dates for all long term goals are 10 weeks  10/02/2022)   1. Patient will demonstrate/report pain at worst less than or equal to 2/10 to facilitate minimal limitation in daily activity secondary to pain symptoms.   Goal status: New   2. Patient will demonstrate independent use of home exercise program to facilitate ability to maintain/progress functional gains from  skilled physical therapy services.   Goal status: New   3. Patient will demonstrate FOTO outcome > or = 64 % to indicate reduced disability due to condition.   Goal status: New   4. Pt will be able to report sitting at her desk for work for >/= 1 hour with pain </= 2/10 in her thoracic spine.   Goal status: New   5.  Pt will be able to lift 5 pounds overhead with no pain reported using correct body mechanics.  Goal status: New       PLAN:   PT FREQUENCY: 1-2x/week   PT DURATION: 8 weeks   PLANNED INTERVENTIONS: Therapeutic exercises, Therapeutic activity, Neuro Muscular re-education, Balance training, Gait training, Patient/Family education, Joint mobilization, Stair training, DME instructions, Dry Needling, Electrical stimulation, Cryotherapy, vasopneumatic device, Moist heat, Taping, Traction Ultrasound, Ionotophoresis 74m/ml Dexamethasone, and Manual therapy.  All included unless contraindicated   PLAN FOR NEXT SESSION: strengthening/stability.  thoracic mobs, scapular mobility, core strengthening    SLaureen Abrahams PT, DPT 08/29/22 3:53 PM

## 2022-09-04 ENCOUNTER — Encounter: Payer: Self-pay | Admitting: Physical Therapy

## 2022-09-04 ENCOUNTER — Ambulatory Visit (INDEPENDENT_AMBULATORY_CARE_PROVIDER_SITE_OTHER): Payer: Self-pay | Admitting: Physical Therapy

## 2022-09-04 DIAGNOSIS — M6281 Muscle weakness (generalized): Secondary | ICD-10-CM

## 2022-09-04 DIAGNOSIS — M546 Pain in thoracic spine: Secondary | ICD-10-CM

## 2022-09-04 DIAGNOSIS — R293 Abnormal posture: Secondary | ICD-10-CM

## 2022-09-04 NOTE — Therapy (Signed)
OUTPATIENT PHYSICAL THERAPY TREATMENT NOTE   Patient Name: Cheryl Juarez MRN: [id number] DOB:June 22, 1981, 42 y.o., female Today's Date: 09/04/2022  PCP: Marda Stalker, PA-C  REFERRING PROVIDER: Marybelle Killings, MD   END OF SESSION:   PT End of Session - 09/04/22 1520     Visit Number 5    Number of Visits 9    Date for PT Re-Evaluation 10/05/22    PT Start Time 0071    PT Stop Time 1555    PT Time Calculation (min) 40 min    Activity Tolerance Patient tolerated treatment well    Behavior During Therapy WFL for tasks assessed/performed               Past Medical History:  Diagnosis Date   Anemia    hx of iron-deficiency anemia, pt states "it comes and goes"   Anxiety    Depression    Pre-diabetes    Past Surgical History:  Procedure Laterality Date   CERVICAL DISC ARTHROPLASTY N/A 11/13/2021   Procedure: C6-7 CERVICAL ANTERIOR DISC ARTHROPLASTY;  Surgeon: Marybelle Killings, MD;  Location: [facility];  Service: Orthopedics;  Laterality: N/A;   EYE SURGERY Left 1987   to fix lazy eye   PATELLAR TENDON REPAIR Left 1997   THYROIDECTOMY, PARTIAL  2003   Patient Active Problem List   Diagnosis Date Noted   S/P cervical disc replacement 07/17/2022   Pain in thoracic spine 07/17/2022   HNP (herniated nucleus pulposus), cervical 11/13/2021    REFERRING DIAG: Z98.890 (ICD-10-CM) - S/P cervical disc replacement M54.6 (ICD-10-CM) - Pain in thoracic spine  THERAPY DIAG:  Pain in thoracic spine  Abnormal posture  Muscle weakness (generalized)  Rationale for Evaluation and Treatment Rehabilitation  PERTINENT HISTORY: Anemia, anxiety, depression, pre-diabetes, cervical disc arthroplasty, eye surgery, patellar tendon repair, thyroidectomy-partial   PRECAUTIONS: none  SUBJECTIVE:                                                                                                                                                                                       SUBJECTIVE STATEMENT:   Pt arriving to therapy reporting 1/10 pain in her thoracic spine.   PAIN:  Are you having pain? 1/10 in mid  thoracic spine    OBJECTIVE: (objective measures completed at initial evaluation unless otherwise dated)  DIAGNOSTIC FINDINGS:  Previous thoracic x-rays demonstrated some disc space narrowing and anterior spurring at T6-7 and no other level changes on x-ray.   PATIENT SURVEYS:  08/07/22: FOTO eval:    59%       COGNITION: Overall cognitive status: WFL normal  SENSATION: WFL       POSTURE: rounded shoulders and forward head   PALPATION: 08/07/22: TTP:    LUMBAR ROM:    AROM Eval 08/07/22  Flexion 86  Extension 42  Right lateral flexion 36  Left lateral flexion 42  Right rotation WFL  Left rotation WFL (mild discomfort and limitation compared to Rt)   (Blank rows = not tested)         LOWER EXTREMITY MMT:     MMT Right 08/07/22 Left 08/07/22  Hip flexion 5/5 5/5  Hip extension      Hip abduction 5/5 5/5  Hip adduction 5/5 5/5  Hip internal rotation      Hip external rotation      Knee flexion 5/5 5/5  Knee extension 5/5 5/5  Ankle dorsiflexion      Ankle plantarflexion      Ankle inversion      Ankle eversion       (Blank rows = not tested)           GAIT: Distance walked: 30 feet  Assistive device utilized: None Level of assistance: Complete Independence Comments: WFL   TODAY'S TREATMENT:  DATE:  09/04/22:   TherEx: UBE L2 x 6 min (3' each direction) Standing ER/scapular retraction 2x10; L3 band Prone UE/LE alternating x 10 reps bil with 5 sec hold Quadraped: threading the needle (thoracic mobility): x 5 each direction holding 3-5 seconds Wall angles: x 10  Rows on BATCA 20# x 10 , 25# x 10  Lat Pull Downs on BATCA 2x10; 15# Wall push ups 2 x 5, with instructions for abdominal activation ER: level 2 band around wrists, walking arms up and down the wall  Gait:  TM: 2.0  mph x 2 minutes progressing to 4.0 mph for 4 minutes, then cool down to 1.0 mph    08/29/22 TherEx: UBE L2 x 6 min (3' each direction) Standing ER/scapular retraction 2x10; L3 band Prone UE/LE alternating x 10 reps bil with 3 sec hold Childs pose stretch x 10-15 sec Quadraped: threading the needle (thoracic mobility): x 5 each direction holding 3-5 seconds Cat/cow x 10 reps Rows on BATCA 2x10; 15# Lat Pull Downs on BATCA 2x10; 15# Seated thoracic extension 10 x 10 sec hold   08/21/22:  TherEx: Rows: Level 4 band x 20 holding 3 sec Shoulder extension: x 15 holding 3 sec Level 3 band Quadraped: threading the needle (thoracic mobility): x 10 each direction holding 3-5 seconds Quadraped thoracic/trunk rotation with arms abd x 10 each side Thoracic mobilization on foam roller 3 x 5  Manual Thoracic grade 3-4 PA mobs: T3-T8  Modalities:  Moist heat x 5 minutes to thoracic spine      PATIENT EDUCATION:  Education details: HEP, POC Person educated: Patient Education method: Consulting civil engineer, Demonstration, Verbal cues, and Handouts Education comprehension: verbalized understanding, returned demonstration, and verbal cues required   HOME EXERCISE PROGRAM: Access Code: [email or web address] URL: [email or web address] Date: 08/07/2022 Prepared by: Kearney Hard  Access Code: [email or web address] URL: [email or web address] Date: 08/14/2022 Prepared by: Kearney Hard  Exercises - Child's Pose with Sidebending  - 2 x daily - 7 x weekly - 3 reps - 20 seconds hold - Standing Shoulder External Rotation with Resistance  - 2 x daily - 7 x weekly - 2 sets - 10 reps - 3 seconds hold - Standing Row with Anchored Resistance  - 2 x daily - 7 x weekly - 2 sets - 10 reps - 3 seconds hold -  Open Book Chest Stretch on Towel Roll  - 1 x daily - 7 x weekly - Thoracic Extension Mobilization with Noodle  - 1 x daily - 7 x weekly - Standing Lean Away Doorway Stretch  - 1 x daily - 7 x weekly  - 3 reps - 15-20 sec hold Exercises - Doorway Rhomboid Stretch  - 2 x daily - 7 x weekly - 3 reps - 20 seconds hold - Child's Pose with Sidebending  - 2 x daily - 7 x weekly - 3 reps - 20 seconds hold - Standing Shoulder External Rotation with Resistance  - 2 x daily - 7 x weekly - 2 sets - 10 reps - 3 seconds hold - Standing Row with Anchored Resistance  - 2 x daily - 7 x weekly - 2 sets - 10 reps - 3 seconds hold   ASSESSMENT:   CLINICAL IMPRESSION: Pt arriving reporting 1/10 pain in mid thoracic region. Pt tolerating exercises well focusing on core strengthening and stability. Continue skilled PT to maximize function.      OBJECTIVE IMPAIRMENTS: pain.movement restrictions    ACTIVITY LIMITATIONS: sitting, bed mobility, and reach over head   PARTICIPATION LIMITATIONS: occupation   PERSONAL FACTORS:  see pertinent history above  are also affecting patient's functional outcome.    REHAB POTENTIAL: Good   CLINICAL DECISION MAKING: Stable/uncomplicated   EVALUATION COMPLEXITY: Low     GOALS: Goals reviewed with patient? Yes   SHORT TERM GOALS: (target date for Short term goals are 3 weeks 08/31/22)   1. Patient will demonstrate independent use of home exercise program to maintain progress from in clinic treatments.   Goal status: MET 08/21/22   LONG TERM GOALS: (target dates for all long term goals are 10 weeks  10/02/2022)   1. Patient will demonstrate/report pain at worst less than or equal to 2/10 to facilitate minimal limitation in daily activity secondary to pain symptoms.   Goal status: Ongoing: 09/04/22    2. Patient will demonstrate independent use of home exercise program to facilitate ability to maintain/progress functional gains from skilled physical therapy services.   Goal status:  Ongoing: 09/04/22    3. Patient will demonstrate FOTO outcome > or = 64 % to indicate reduced disability due to condition.   Goal status: New   4. Pt will be able to report sitting  at her desk for work for >/= 1 hour with pain </= 2/10 in her thoracic spine.   Goal status: Ongoing: 09/04/22    5.  Pt will be able to lift 5 pounds overhead with no pain reported using correct body mechanics.  Goal status: Ongoing: 09/04/22        PLAN:   PT FREQUENCY: 1-2x/week   PT DURATION: 8 weeks   PLANNED INTERVENTIONS: Therapeutic exercises, Therapeutic activity, Neuro Muscular re-education, Balance training, Gait training, Patient/Family education, Joint mobilization, Stair training, DME instructions, Dry Needling, Electrical stimulation, Cryotherapy, vasopneumatic device, Moist heat, Taping, Traction Ultrasound, Ionotophoresis 75m/ml Dexamethasone, and Manual therapy.  All included unless contraindicated   PLAN FOR NEXT SESSION: strengthening/stability.  thoracic mobs, scapular mobility, core strengthening   JKearney Hard PT, MPT 09/04/22 3:23 PM   09/04/22 3:23 PM

## 2022-09-11 ENCOUNTER — Encounter: Payer: BC Managed Care – PPO | Admitting: Physical Therapy

## 2022-09-18 ENCOUNTER — Encounter: Payer: Self-pay | Admitting: Physical Therapy

## 2022-09-18 ENCOUNTER — Ambulatory Visit (INDEPENDENT_AMBULATORY_CARE_PROVIDER_SITE_OTHER): Payer: BC Managed Care – PPO | Admitting: Physical Therapy

## 2022-09-18 DIAGNOSIS — M546 Pain in thoracic spine: Secondary | ICD-10-CM | POA: Diagnosis not present

## 2022-09-18 DIAGNOSIS — R293 Abnormal posture: Secondary | ICD-10-CM

## 2022-09-18 DIAGNOSIS — M6281 Muscle weakness (generalized): Secondary | ICD-10-CM | POA: Diagnosis not present

## 2022-09-18 NOTE — Therapy (Signed)
OUTPATIENT PHYSICAL THERAPY TREATMENT NOTE   Patient Name: Cheryl Juarez MRN: [id number] DOB:[birth year], 42 y.o., female Today's Date: 09/18/2022  PCP: Marda Stalker, PA-C  REFERRING PROVIDER: Marybelle Killings, MD   END OF SESSION:   PT End of Session - 09/18/22 1601     Visit Number 6    Number of Visits 9    Date for PT Re-Evaluation 10/05/22    PT Start Time 7322    PT Stop Time 1558    PT Time Calculation (min) 43 min    Activity Tolerance Patient tolerated treatment well    Behavior During Therapy WFL for tasks assessed/performed                Past Medical History:  Diagnosis Date   Anemia    hx of iron-deficiency anemia, pt states "it comes and goes"   Anxiety    Depression    Pre-diabetes    Past Surgical History:  Procedure Laterality Date   CERVICAL DISC ARTHROPLASTY N/A 11/13/2021   Procedure: C6-7 CERVICAL ANTERIOR DISC ARTHROPLASTY;  Surgeon: Marybelle Killings, MD;  Location: [facility];  Service: Orthopedics;  Laterality: N/A;   EYE SURGERY Left 1987   to fix lazy eye   PATELLAR TENDON REPAIR Left 1997   THYROIDECTOMY, PARTIAL  2003   Patient Active Problem List   Diagnosis Date Noted   S/P cervical disc replacement 07/17/2022   Pain in thoracic spine 07/17/2022   HNP (herniated nucleus pulposus), cervical 11/13/2021    REFERRING DIAG: Z98.890 (ICD-10-CM) - S/P cervical disc replacement M54.6 (ICD-10-CM) - Pain in thoracic spine  THERAPY DIAG:  Pain in thoracic spine  Muscle weakness (generalized)  Abnormal posture  Rationale for Evaluation and Treatment Rehabilitation  PERTINENT HISTORY: Anemia, anxiety, depression, pre-diabetes, cervical disc arthroplasty, eye surgery, patellar tendon repair, thyroidectomy-partial   PRECAUTIONS: none  SUBJECTIVE:                                                                                                                                                                                       SUBJECTIVE STATEMENT:   Pt arriving to therapy reporting 5/10 pain in her thoracic spine. Pt stating her Gabapentin ran out and her pain has increased.   PAIN:  Are you having pain? 5/10 in mid  thoracic spine    OBJECTIVE: (objective measures completed at initial evaluation unless otherwise dated)  DIAGNOSTIC FINDINGS:  Previous thoracic x-rays demonstrated some disc space narrowing and anterior spurring at T6-7 and no other level changes on x-ray.   PATIENT SURVEYS:  08/07/22: FOTO eval:    59%       COGNITION: Overall  cognitive status: WFL normal                                     SENSATION: WFL       POSTURE: rounded shoulders and forward head   PALPATION: 08/07/22: TTP:    LUMBAR ROM:    AROM Eval 08/07/22  Flexion 86  Extension 42  Right lateral flexion 36  Left lateral flexion 42  Right rotation WFL  Left rotation WFL (mild discomfort and limitation compared to Rt)   (Blank rows = not tested)         LOWER EXTREMITY MMT:     MMT Right 08/07/22 Left 08/07/22  Hip flexion 5/5 5/5  Hip extension      Hip abduction 5/5 5/5  Hip adduction 5/5 5/5  Hip internal rotation      Hip external rotation      Knee flexion 5/5 5/5  Knee extension 5/5 5/5  Ankle dorsiflexion      Ankle plantarflexion      Ankle inversion      Ankle eversion       (Blank rows = not tested)           GAIT: Distance walked: 30 feet  Assistive device utilized: None Level of assistance: Complete Independence Comments: WFL   TODAY'S TREATMENT:  DATE:  09/18/22:   TherEx: UBE L2.5 x 6 min (3' each direction) Standing ER/scapular retraction 2x10; L3 band Prone UE/LE alternating x 10 reps bil with 5 sec hold Quadraped: threading the needle (thoracic mobility): x 5 each direction holding 3-5 seconds Wall angles: x 10  Rows on BATCA 25#  2 x 10 ,  Lat Pull Downs on BATCA 2x10; 15# Wall push ups 2 x 5, with instructions for abdominal activation ER: level 2 band around  wrists, walking arms up and down the wall  Gait:  TM: 2.0 mph x 2 minutes progressing to 4.0 mph for 4 minutes, then cool down to 1.0 mph  09/04/22:   TherEx: UBE L2 x 6 min (3' each direction) Standing ER/scapular retraction 2x10; L3 band Prone UE/LE alternating x 10 reps bil with 5 sec hold Quadraped: threading the needle (thoracic mobility): x 5 each direction holding 3-5 seconds Wall angles: x 10  Rows on BATCA 20# x 10 , 25# x 10  Lat Pull Downs on BATCA 2x10; 15# Wall push ups 2 x 5, with instructions for abdominal activation ER: level 2 band around wrists, walking arms up and down the wall  Gait:  TM: 2.0 mph x 2 minutes progressing to 4.0 mph for 4 minutes, then cool down to 1.0 mph        PATIENT EDUCATION:  Education details: HEP, POC Person educated: Patient Education method: Programmer, multimedia, Facilities manager, Verbal cues, and Handouts Education comprehension: verbalized understanding, returned demonstration, and verbal cues required   HOME EXERCISE PROGRAM: Access Code: [email or web address] URL: [email or web address] Date: 09/18/2022 Prepared by: Narda Amber  Exercises - Child's Pose with Sidebending  - 2 x daily - 7 x weekly - 3 reps - 20 seconds hold - Standing Shoulder External Rotation with Resistance  - 2 x daily - 7 x weekly - 2 sets - 10 reps - 3 seconds hold - Standing Row with Anchored Resistance  - 2 x daily - 7 x weekly - 2 sets - 10 reps - 3 seconds hold - Open Book Chest Stretch on Towel  Roll  - 1 x daily - 7 x weekly - Thoracic Extension Mobilization with Noodle  - 1 x daily - 7 x weekly - Standing Lean Away Doorway Stretch  - 1 x daily - 7 x weekly - 3 reps - 15-20 sec hold - Quadruped Thoracic Rotation - Reach Under  - 1 x daily - 7 x weekly - 10 reps - Abdominal Press into [place] and Roll  - 1 x daily - 7 x weekly - 2 sets - 10 reps - Bird Dog  - 1 x daily - 7 x weekly - 10 reps - Dead Bug  - 1 x daily - 7 x weekly - 10 reps   ASSESSMENT:    CLINICAL IMPRESSION: Pt experiencing more pain today which was reported as 5/10 due to running out of her Gabapentin. Pt tolerating core strengthening exercises well with her HEP updated. Continue skilled PT to maximize pt's function.      OBJECTIVE IMPAIRMENTS: pain.movement restrictions    ACTIVITY LIMITATIONS: sitting, bed mobility, and reach over head   PARTICIPATION LIMITATIONS: occupation   PERSONAL FACTORS:  see pertinent history above  are also affecting patient's functional outcome.    REHAB POTENTIAL: Good   CLINICAL DECISION MAKING: Stable/uncomplicated   EVALUATION COMPLEXITY: Low     GOALS: Goals reviewed with patient? Yes   SHORT TERM GOALS: (target date for Short term goals are 3 weeks 08/31/22)   1. Patient will demonstrate independent use of home exercise program to maintain progress from in clinic treatments.   Goal status: MET 08/21/22   LONG TERM GOALS: (target dates for all long term goals are 10 weeks  10/02/2022)   1. Patient will demonstrate/report pain at worst less than or equal to 2/10 to facilitate minimal limitation in daily activity secondary to pain symptoms.   Goal status: Ongoing: 09/04/22    2. Patient will demonstrate independent use of home exercise program to facilitate ability to maintain/progress functional gains from skilled physical therapy services.   Goal status:  Ongoing: 09/04/22    3. Patient will demonstrate FOTO outcome > or = 64 % to indicate reduced disability due to condition.   Goal status: New   4. Pt will be able to report sitting at her desk for work for >/= 1 hour with pain </= 2/10 in her thoracic spine.   Goal status: Ongoing: 09/04/22    5.  Pt will be able to lift 5 pounds overhead with no pain reported using correct body mechanics.  Goal status: Ongoing: 09/04/22        PLAN:   PT FREQUENCY: 1-2x/week   PT DURATION: 8 weeks   PLANNED INTERVENTIONS: Therapeutic exercises, Therapeutic activity, Neuro  Muscular re-education, Balance training, Gait training, Patient/Family education, Joint mobilization, Stair training, DME instructions, Dry Needling, Electrical stimulation, Cryotherapy, vasopneumatic device, Moist heat, Taping, Traction Ultrasound, Ionotophoresis 4mg /ml Dexamethasone, and Manual therapy.  All included unless contraindicated   PLAN FOR NEXT SESSION: strengthening/stability.  thoracic mobs, scapular mobility, core strengthening   Kearney Hard, PT, MPT 09/18/22 4:02 PM   09/18/22 4:02 PM

## 2022-09-25 ENCOUNTER — Ambulatory Visit (INDEPENDENT_AMBULATORY_CARE_PROVIDER_SITE_OTHER): Payer: BC Managed Care – PPO | Admitting: Physical Therapy

## 2022-09-25 ENCOUNTER — Encounter: Payer: Self-pay | Admitting: Physical Therapy

## 2022-09-25 DIAGNOSIS — R293 Abnormal posture: Secondary | ICD-10-CM

## 2022-09-25 DIAGNOSIS — M546 Pain in thoracic spine: Secondary | ICD-10-CM

## 2022-09-25 DIAGNOSIS — M6281 Muscle weakness (generalized): Secondary | ICD-10-CM | POA: Diagnosis not present

## 2022-09-25 NOTE — Therapy (Signed)
OUTPATIENT PHYSICAL THERAPY TREATMENT NOTE   Patient Name: Cheryl Juarez MRN: [id number] DOB:11/12/1980, 42 y.o., female Today's Date: 09/25/2022  PCP: Jarrett Soho, PA-C  REFERRING PROVIDER: Eldred Manges, MD   END OF SESSION:   PT End of Session - 09/25/22 1608     Visit Number 7    Number of Visits 9    Date for PT Re-Evaluation 10/05/22    PT Start Time 1517    PT Stop Time 1557    PT Time Calculation (min) 40 min    Activity Tolerance Patient tolerated treatment well    Behavior During Therapy WFL for tasks assessed/performed                 Past Medical History:  Diagnosis Date   Anemia    hx of iron-deficiency anemia, pt states "it comes and goes"   Anxiety    Depression    Pre-diabetes    Past Surgical History:  Procedure Laterality Date   CERVICAL DISC ARTHROPLASTY N/A 11/13/2021   Procedure: C6-7 CERVICAL ANTERIOR DISC ARTHROPLASTY;  Surgeon: Eldred Manges, MD;  Location: [facility];  Service: Orthopedics;  Laterality: N/A;   EYE SURGERY Left 1987   to fix lazy eye   PATELLAR TENDON REPAIR Left 1997   THYROIDECTOMY, PARTIAL  2003   Patient Active Problem List   Diagnosis Date Noted   S/P cervical disc replacement 07/17/2022   Pain in thoracic spine 07/17/2022   HNP (herniated nucleus pulposus), cervical 11/13/2021    REFERRING DIAG: Z98.890 (ICD-10-CM) - S/P cervical disc replacement M54.6 (ICD-10-CM) - Pain in thoracic spine  THERAPY DIAG:  Pain in thoracic spine  Muscle weakness (generalized)  Abnormal posture  Rationale for Evaluation and Treatment Rehabilitation  PERTINENT HISTORY: Anemia, anxiety, depression, pre-diabetes, cervical disc arthroplasty, eye surgery, patellar tendon repair, thyroidectomy-partial   PRECAUTIONS: none  SUBJECTIVE:                                                                                                                                                                                       SUBJECTIVE STATEMENT:   Pt arriving to therapy reporting 5/10 pain in her thoracic spine. Pt stating her Gabapentin ran out and her pain has increased.   PAIN:  Are you having pain? 5/10 in mid  thoracic spine    OBJECTIVE: (objective measures completed at initial evaluation unless otherwise dated)  DIAGNOSTIC FINDINGS:  Previous thoracic x-rays demonstrated some disc space narrowing and anterior spurring at T6-7 and no other level changes on x-ray.   PATIENT SURVEYS:  08/07/22: FOTO eval:    59%       COGNITION:  Overall cognitive status: WFL normal                                     SENSATION: WFL       POSTURE: rounded shoulders and forward head   PALPATION: 08/07/22: TTP:    LUMBAR ROM:    AROM Eval 08/07/22  Flexion 86  Extension 42  Right lateral flexion 36  Left lateral flexion 42  Right rotation WFL  Left rotation WFL (mild discomfort and limitation compared to Rt)   (Blank rows = not tested)         LOWER EXTREMITY MMT:     MMT Right 08/07/22 Left 08/07/22  Hip flexion 5/5 5/5  Hip extension      Hip abduction 5/5 5/5  Hip adduction 5/5 5/5  Hip internal rotation      Hip external rotation      Knee flexion 5/5 5/5  Knee extension 5/5 5/5  Ankle dorsiflexion      Ankle plantarflexion      Ankle inversion      Ankle eversion       (Blank rows = not tested)           GAIT: Distance walked: 30 feet  Assistive device utilized: None Level of assistance: Complete Independence Comments: WFL   TODAY'S TREATMENT:  DATE:  09/25/22:  TherEx: UBE L2.5 x 6 min (3' each direction) BATCA: Rows: 20# 2 x 10 BATCA: lat pull downs: 15# 2 x 10 BATCA: shoulder extension 5# each UE using pulleys 2 x 10 Dead bug: x 15 (knee flexed 90 degrees) Full Plank: 20 second hold Modified plank on knees while moving from elbows to arms extended x 5 (performed again leading with opposite UE) Abd sets/presses using red physio-ball x 10 holding 5  seconds Modified plank with elbows on red physio-ball holding 20 sec x 3  wall push up position elbows extended with each hand on yellow therapy performing circles using the balls x 20   09/18/22:   TherEx: UBE L2.5 x 6 min (3' each direction) Standing ER/scapular retraction 2x10; L3 band Prone UE/LE alternating x 10 reps bil with 5 sec hold Quadraped: threading the needle (thoracic mobility): x 5 each direction holding 3-5 seconds Wall angles: x 10  Rows on BATCA 25#  2 x 10 ,  Lat Pull Downs on BATCA 2x10; 15# Wall push ups 2 x 5, with instructions for abdominal activation ER: level 2 band around wrists, walking arms up and down the wall  Gait:  TM: 2.0 mph x 2 minutes progressing to 4.0 mph for 4 minutes, then cool down to 1.0 mph  09/04/22:   TherEx: UBE L2 x 6 min (3' each direction) Standing ER/scapular retraction 2x10; L3 band Prone UE/LE alternating x 10 reps bil with 5 sec hold Quadraped: threading the needle (thoracic mobility): x 5 each direction holding 3-5 seconds Wall angles: x 10  Rows on BATCA 20# x 10 , 25# x 10  Lat Pull Downs on BATCA 2x10; 15# Wall push ups 2 x 5, with instructions for abdominal activation ER: level 2 band around wrists, walking arms up and down the wall  Gait:  TM: 2.0 mph x 2 minutes progressing to 4.0 mph for 4 minutes, then cool down to 1.0 mph        PATIENT EDUCATION:  Education details: HEP, POC Person educated: Patient Education method:  Explanation, Demonstration, Verbal cues, and Handouts Education comprehension: verbalized understanding, returned demonstration, and verbal cues required   HOME EXERCISE PROGRAM: Access Code: [email or web address] URL: [email or web address] Date: 09/18/2022 Prepared by: Kearney Hard  Exercises - Child's Pose with Sidebending  - 2 x daily - 7 x weekly - 3 reps - 20 seconds hold - Standing Shoulder External Rotation with Resistance  - 2 x daily - 7 x weekly - 2 sets - 10 reps - 3 seconds  hold - Standing Row with Anchored Resistance  - 2 x daily - 7 x weekly - 2 sets - 10 reps - 3 seconds hold - Open Book Chest Stretch on Towel Roll  - 1 x daily - 7 x weekly - Thoracic Extension Mobilization with Noodle  - 1 x daily - 7 x weekly - Standing Lean Away Doorway Stretch  - 1 x daily - 7 x weekly - 3 reps - 15-20 sec hold - Quadruped Thoracic Rotation - Reach Under  - 1 x daily - 7 x weekly - 10 reps - Abdominal Press into [place] and Roll  - 1 x daily - 7 x weekly - 2 sets - 10 reps - Bird Dog  - 1 x daily - 7 x weekly - 10 reps - Dead Bug  - 1 x daily - 7 x weekly - 10 reps   ASSESSMENT:   CLINICAL IMPRESSION: Pt arriving reporting 3/10 pain in her thoracic spine. Treatment continues to focus on core and spinal stabilization exercises. Pt tolerating well.  Continue skilled PT to maximize pt's function.      OBJECTIVE IMPAIRMENTS: pain.movement restrictions    ACTIVITY LIMITATIONS: sitting, bed mobility, and reach over head   PARTICIPATION LIMITATIONS: occupation   PERSONAL FACTORS:  see pertinent history above  are also affecting patient's functional outcome.    REHAB POTENTIAL: Good   CLINICAL DECISION MAKING: Stable/uncomplicated   EVALUATION COMPLEXITY: Low     GOALS: Goals reviewed with patient? Yes   SHORT TERM GOALS: (target date for Short term goals are 3 weeks 08/31/22)   1. Patient will demonstrate independent use of home exercise program to maintain progress from in clinic treatments.   Goal status: MET 08/21/22   LONG TERM GOALS: (target dates for all long term goals are 10 weeks  10/02/2022)   1. Patient will demonstrate/report pain at worst less than or equal to 2/10 to facilitate minimal limitation in daily activity secondary to pain symptoms.   Goal status: Ongoing: 09/04/22    2. Patient will demonstrate independent use of home exercise program to facilitate ability to maintain/progress functional gains from skilled physical therapy services.    Goal status:  Ongoing: 09/04/22    3. Patient will demonstrate FOTO outcome > or = 64 % to indicate reduced disability due to condition.   Goal status: New   4. Pt will be able to report sitting at her desk for work for >/= 1 hour with pain </= 2/10 in her thoracic spine.   Goal status: Ongoing: 09/04/22    5.  Pt will be able to lift 5 pounds overhead with no pain reported using correct body mechanics.  Goal status: Ongoing: 09/04/22        PLAN:   PT FREQUENCY: 1-2x/week   PT DURATION: 8 weeks   PLANNED INTERVENTIONS: Therapeutic exercises, Therapeutic activity, Neuro Muscular re-education, Balance training, Gait training, Patient/Family education, Joint mobilization, Stair training, DME instructions, Dry Needling, Electrical stimulation, Cryotherapy, vasopneumatic device, Moist heat, Taping, Traction Ultrasound,  Ionotophoresis 4mg /ml Dexamethasone, and Manual therapy.  All included unless contraindicated   PLAN FOR NEXT SESSION: strengthening/stability.  thoracic mobs, scapular mobility, core strengthening   , PT, MPT 09/25/22 4:09 PM   09/25/22 4:09 PM

## 2022-10-02 ENCOUNTER — Encounter: Payer: Self-pay | Admitting: Physical Therapy

## 2022-10-02 ENCOUNTER — Ambulatory Visit (INDEPENDENT_AMBULATORY_CARE_PROVIDER_SITE_OTHER): Payer: BC Managed Care – PPO | Admitting: Physical Therapy

## 2022-10-02 DIAGNOSIS — R293 Abnormal posture: Secondary | ICD-10-CM | POA: Diagnosis not present

## 2022-10-02 DIAGNOSIS — M546 Pain in thoracic spine: Secondary | ICD-10-CM

## 2022-10-02 DIAGNOSIS — M6281 Muscle weakness (generalized): Secondary | ICD-10-CM | POA: Diagnosis not present

## 2022-10-02 NOTE — Therapy (Signed)
OUTPATIENT PHYSICAL THERAPY TREATMENT NOTE Discharge   Patient Name: Cheryl Juarez MRN: [id number] DOB:1980-11-01, 42 y.o., female Today's Date: 10/02/2022  PCP: Jarrett Soho, PA-C  REFERRING PROVIDER: Eldred Manges, MD   END OF SESSION:   PT End of Session - 10/02/22 1528     Visit Number 8    Number of Visits 9    Date for PT Re-Evaluation 10/05/22    PT Start Time 1520    PT Stop Time 1600    PT Time Calculation (min) 40 min    Activity Tolerance Patient tolerated treatment well    Behavior During Therapy WFL for tasks assessed/performed                 Past Medical History:  Diagnosis Date   Anemia    hx of iron-deficiency anemia, pt states "it comes and goes"   Anxiety    Depression    Pre-diabetes    Past Surgical History:  Procedure Laterality Date   CERVICAL DISC ARTHROPLASTY N/A 11/13/2021   Procedure: C6-7 CERVICAL ANTERIOR DISC ARTHROPLASTY;  Surgeon: Eldred Manges, MD;  Location: [facility] OR;  Service: Orthopedics;  Laterality: N/A;   EYE SURGERY Left 1987   to fix lazy eye   PATELLAR TENDON REPAIR Left 1997   THYROIDECTOMY, PARTIAL  2003   Patient Active Problem List   Diagnosis Date Noted   S/P cervical disc replacement 07/17/2022   Pain in thoracic spine 07/17/2022   HNP (herniated nucleus pulposus), cervical 11/13/2021    REFERRING DIAG: Z98.890 (ICD-10-CM) - S/P cervical disc replacement M54.6 (ICD-10-CM) - Pain in thoracic spine  THERAPY DIAG:  Pain in thoracic spine  Muscle weakness (generalized)  Abnormal posture  Rationale for Evaluation and Treatment Rehabilitation  PERTINENT HISTORY: Anemia, anxiety, depression, pre-diabetes, cervical disc arthroplasty, eye surgery, patellar tendon repair, thyroidectomy-partial   PRECAUTIONS: none  SUBJECTIVE:                                                                                                                                                                                       SUBJECTIVE STATEMENT:   Pt arriving today reporting 3/10 pain in her mid back. Pt stating her 10 mile run went better than her 8 and 9 mile runs.   PAIN:  Are you having pain? 3/10 in mid  thoracic spine    OBJECTIVE: (objective measures completed at initial evaluation unless otherwise dated)  DIAGNOSTIC FINDINGS:  Previous thoracic x-rays demonstrated some disc space narrowing and anterior spurring at T6-7 and no other level changes on x-ray.   PATIENT SURVEYS:  08/07/22: FOTO eval:    59% 10/02/22 FOTO 77%  COGNITION: Overall cognitive status: WFL normal                                     SENSATION: WFL       POSTURE: rounded shoulders and forward head   PALPATION: 08/07/22: TTP:    LUMBAR ROM:    AROM Eval 08/07/22 10/02/22  Flexion 86 106  Extension 42 46  Right lateral flexion 36 45  Left lateral flexion 42 45  Right rotation [facility] WFL   Left rotation WFL (mild discomfort and limitation compared to Rt) WFL   (Blank rows = not tested)         LOWER EXTREMITY MMT:     MMT Right 08/07/22 Left 08/07/22  Hip flexion 5/5 5/5  Hip extension      Hip abduction 5/5 5/5  Hip adduction 5/5 5/5  Hip internal rotation      Hip external rotation      Knee flexion 5/5 5/5  Knee extension 5/5 5/5  Ankle dorsiflexion      Ankle plantarflexion      Ankle inversion      Ankle eversion       (Blank rows = not tested)           GAIT: Distance walked: 30 feet  Assistive device utilized: None Level of assistance: Complete Independence Comments: WFL   TODAY'S TREATMENT:  DATE:  10/02/22:  TherEx: UBE L2.5 x 6 min (3' each direction) BATCA: Rows: 20# 2 x 10 BATCA: lat pull downs: 15# 2 x 10 Wall angles: x 10 Wall push ups x 10 Shoulder stability with ball against the wall x 15 circles each direction  Review of pt's entire HEP and updates performed   09/25/22:  TherEx: UBE L2.5 x 6 min (3' each direction) BATCA: Rows: 20# 2 x 10 BATCA: lat  pull downs: 15# 2 x 10 BATCA: shoulder extension 5# each UE using pulleys 2 x 10 Dead bug: x 15 (knee flexed 90 degrees) Full Plank: 20 second hold Modified plank on knees while moving from elbows to arms extended x 5 (performed again leading with opposite UE) Abd sets/presses using red physio-ball x 10 holding 5 seconds Modified plank with elbows on red physio-ball holding 20 sec x 3  wall push up position elbows extended with each hand on yellow therapy performing circles using the balls x 20   09/18/22:   TherEx: UBE L2.5 x 6 min (3' each direction) Standing ER/scapular retraction 2x10; L3 band Prone UE/LE alternating x 10 reps bil with 5 sec hold Quadraped: threading the needle (thoracic mobility): x 5 each direction holding 3-5 seconds Wall angles: x 10  Rows on BATCA 25#  2 x 10 ,  Lat Pull Downs on BATCA 2x10; 15# Wall push ups 2 x 5, with instructions for abdominal activation ER: level 2 band around wrists, walking arms up and down the wall  Gait:  TM: 2.0 mph x 2 minutes progressing to 4.0 mph for 4 minutes, then cool down to 1.0 mph         PATIENT EDUCATION:  Education details: HEP, POC Person educated: Patient Education method: Consulting civil engineer, Media planner, Verbal cues, and Handouts Education comprehension: verbalized understanding, returned demonstration, and verbal cues required   HOME EXERCISE PROGRAM: Access Code: [email or web address] URL: [email or web address] Date: 10/02/2022 Prepared by: Kearney Hard  Exercises - Child's Pose with Sidebending  - 2 x daily -  7 x weekly - 3 reps - 20 seconds hold - Standing Shoulder External Rotation with Resistance  - 2 x daily - 7 x weekly - 2 sets - 10 reps - 3 seconds hold - Standing Row with Anchored Resistance  - 2 x daily - 7 x weekly - 2 sets - 10 reps - 3 seconds hold - Open Book Chest Stretch on Towel Roll  - 1 x daily - 7 x weekly - Thoracic Extension Mobilization with Noodle  - 1 x daily - 7 x weekly -  Standing Lean Away Doorway Stretch  - 1 x daily - 7 x weekly - 3 reps - 15-20 sec hold - Quadruped Thoracic Rotation - Reach Under  - 1 x daily - 7 x weekly - 10 reps - Abdominal Press into [place] and Roll  - 1 x daily - 7 x weekly - 2 sets - 10 reps - Bird Dog  - 1 x daily - 7 x weekly - 10 reps - Dead Bug  - 1 x daily - 7 x weekly - 10 reps - Wall Push Up with Arms Wide  - 1 x daily - 7 x weekly - 10 reps - Wall Angels  - 1 x daily - 7 x weekly - 10 reps - Standing Wall [employer] with Mini Swiss Ball  - 1 x daily - 7 x weekly - 15 reps - Bridge with Arms at [employer] and Feet on [employer]  - 1 x daily - 7 x weekly - 2 sets - 10 reps - 5 seconds hold   ASSESSMENT:   CLINICAL IMPRESSION: Pt arriving today for her 8th PT visit. Pt reporting soreness following her last visit. Pt was advised when performing her HEP to pick and choose a few exercises each day and perform them without performing all exercises daily. Pt reporting her 10 mile run this weekend went well. Pt's FOTO score has improved to 77%. Pt's lumbar and thoracic mobility have improved since her initial evaluation. Pt is being discharged from skilled PT services.      OBJECTIVE IMPAIRMENTS: pain.movement restrictions    ACTIVITY LIMITATIONS: sitting, bed mobility, and reach over head   PARTICIPATION LIMITATIONS: occupation   PERSONAL FACTORS:  see pertinent history above  are also affecting patient's functional outcome.    REHAB POTENTIAL: Good   CLINICAL DECISION MAKING: Stable/uncomplicated   EVALUATION COMPLEXITY: Low     GOALS: Goals reviewed with patient? Yes   SHORT TERM GOALS: (target date for Short term goals are 3 weeks 08/31/22)   1. Patient will demonstrate independent use of home exercise program to maintain progress from in clinic treatments.   Goal status: MET 08/21/22   LONG TERM GOALS: (target dates for all long term goals are 10 weeks  10/02/2022)   1. Patient will demonstrate/report pain at worst  less than or equal to 2/10 to facilitate minimal limitation in daily activity secondary to pain symptoms.   Goal status: PARTIALLY MET 10/02/22   2. Patient will demonstrate independent use of home exercise program to facilitate ability to maintain/progress functional gains from skilled physical therapy services.   Goal status:  MET 10/02/22   3. Patient will demonstrate FOTO outcome > or = 64 % to indicate reduced disability due to condition.   Goal status: MET 10/02/22   4. Pt will be able to report sitting at her desk for work for >/= 1 hour with pain </= 2/10 in her thoracic spine.  Goal status: MET 10/02/22   5.  Pt will be able to lift 5 pounds overhead with no pain reported using correct body mechanics.  Goal status: MET 10/02/22       PLAN:   PT FREQUENCY: 1-2x/week   PT DURATION: 8 weeks   PLANNED INTERVENTIONS: Therapeutic exercises, Therapeutic activity, Neuro Muscular re-education, Balance training, Gait training, Patient/Family education, Joint mobilization, Stair training, DME instructions, Dry Needling, Electrical stimulation, Cryotherapy, vasopneumatic device, Moist heat, Taping, Traction Ultrasound, Ionotophoresis 4mg /ml Dexamethasone, and Manual therapy.  All included unless contraindicated   PLAN FOR NEXT SESSION: updated HEP and discharge   Kearney Hard, PT, MPT 10/02/22 3:29 PM   10/02/22 3:29 PM   PHYSICAL THERAPY DISCHARGE SUMMARY  Visits from Start of Care: 8  Current functional level related to goals / functional outcomes: See above   Remaining deficits: See above   Education / Equipment: HEP   Patient agrees to discharge. Patient goals were partially met. Patient is being discharged due to meeting the stated rehab goals.

## 2022-10-22 ENCOUNTER — Other Ambulatory Visit: Payer: Self-pay | Admitting: Orthopaedic Surgery

## 2022-11-01 DIAGNOSIS — K120 Recurrent oral aphthae: Secondary | ICD-10-CM | POA: Diagnosis not present

## 2022-11-01 DIAGNOSIS — Z03818 Encounter for observation for suspected exposure to other biological agents ruled out: Secondary | ICD-10-CM | POA: Diagnosis not present

## 2022-11-01 DIAGNOSIS — R0981 Nasal congestion: Secondary | ICD-10-CM | POA: Diagnosis not present

## 2022-11-01 DIAGNOSIS — J029 Acute pharyngitis, unspecified: Secondary | ICD-10-CM | POA: Diagnosis not present

## 2022-11-01 DIAGNOSIS — J069 Acute upper respiratory infection, unspecified: Secondary | ICD-10-CM | POA: Diagnosis not present

## 2022-11-01 DIAGNOSIS — R051 Acute cough: Secondary | ICD-10-CM | POA: Diagnosis not present

## 2022-11-27 DIAGNOSIS — Z0000 Encounter for general adult medical examination without abnormal findings: Secondary | ICD-10-CM | POA: Diagnosis not present

## 2022-12-03 DIAGNOSIS — Z13220 Encounter for screening for lipoid disorders: Secondary | ICD-10-CM | POA: Diagnosis not present

## 2022-12-03 DIAGNOSIS — R7303 Prediabetes: Secondary | ICD-10-CM | POA: Diagnosis not present

## 2022-12-03 DIAGNOSIS — E559 Vitamin D deficiency, unspecified: Secondary | ICD-10-CM | POA: Diagnosis not present

## 2022-12-03 DIAGNOSIS — D509 Iron deficiency anemia, unspecified: Secondary | ICD-10-CM | POA: Diagnosis not present

## 2022-12-27 ENCOUNTER — Other Ambulatory Visit: Payer: Self-pay | Admitting: Orthopaedic Surgery

## 2023-03-06 ENCOUNTER — Other Ambulatory Visit: Payer: Self-pay | Admitting: Orthopaedic Surgery

## 2023-05-17 ENCOUNTER — Other Ambulatory Visit: Payer: Self-pay | Admitting: Orthopaedic Surgery

## 2023-05-27 IMAGING — MR MR CERVICAL SPINE W/O CM
6 of 7 series · 32 of 48 positions shown · non-contrast
Comparison: Cervical spine plain films 09/26/2021.

CLINICAL DATA: Chronic neck pain. Right arm pain C6 distribution.

EXAM:
MRI CERVICAL SPINE WITHOUT CONTRAST
TECHNIQUE: Multiplanar, multisequence MR imaging of the cervical spine was
performed. No intravenous contrast was administered.

[Series 5: T1 · sagittal · 3.0mm · 0.69mm/px · 4 of 15 slices shown (1 of 2)]
[im 1/15]
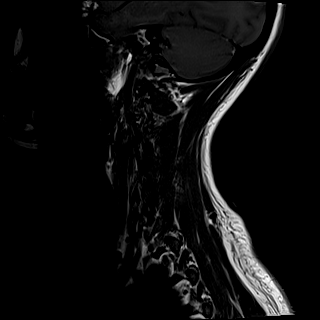
[im 5/15]
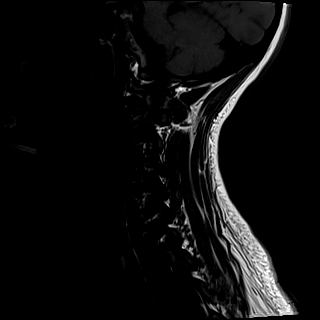
[im 10/15]
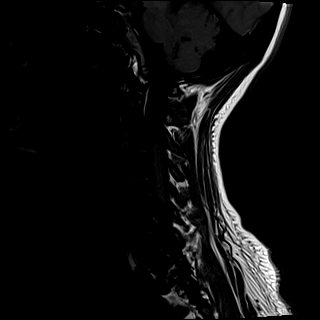
[im 15/15]
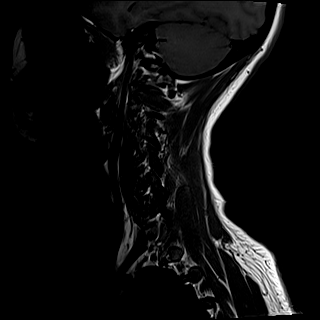

[Series 6: T2 · sagittal · 3.0mm · 0.69mm/px · 5 of 15 slices shown (1 of 2)]
[im 1/15]
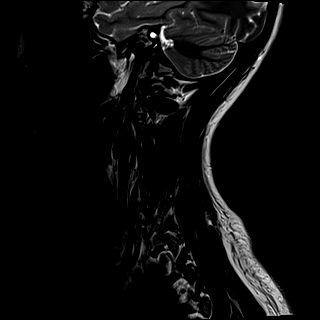
[im 4/15]
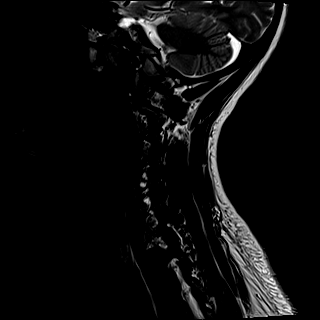
[im 8/15]
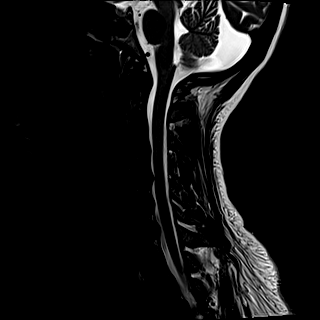
[im 11/15]
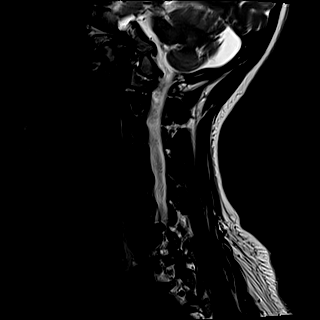
[im 15/15]
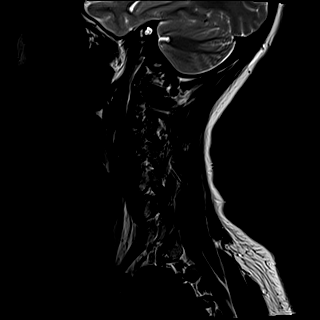

[Series 7: STIR · sagittal · 3.0mm · 0.86mm/px · 5 of 15 slices shown]
[im 1/15]
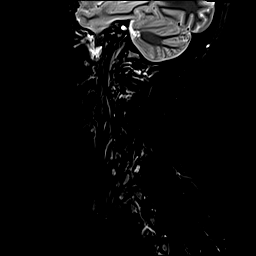
[im 4/15]
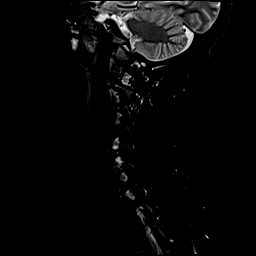
[im 8/15]
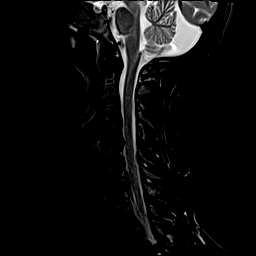
[im 11/15]
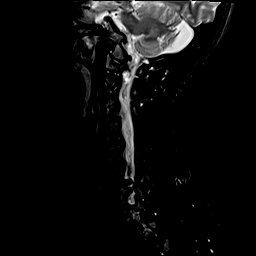
[im 15/15]
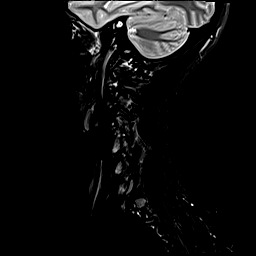

[Series 8: T2 · axial · 3.0mm · 0.56mm/px · z∈[-112,-7]mm · 8 of 30 slices shown (2 of 2)]
[im 1/30]
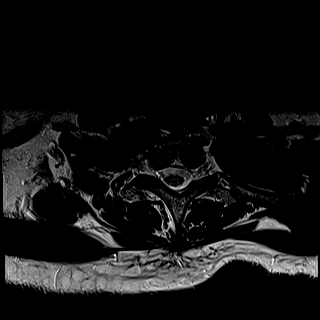
[im 4/30]
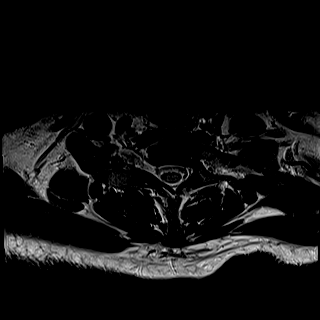
[im 10/30]
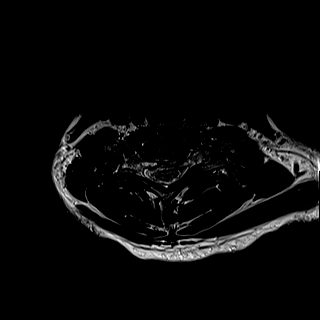
[im 13/30]
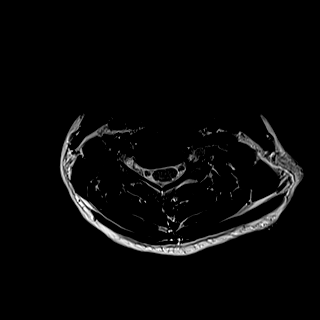
[im 17/30]
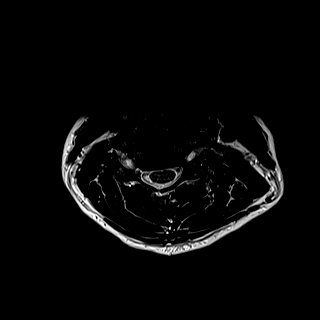
[im 20/30]
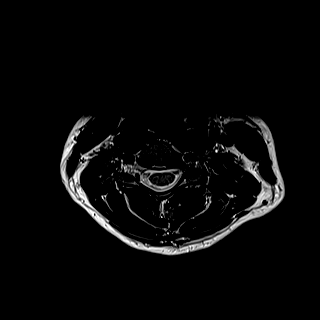
[im 26/30]
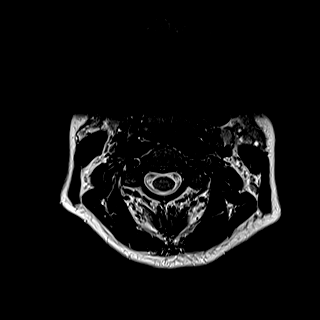
[im 30/30]
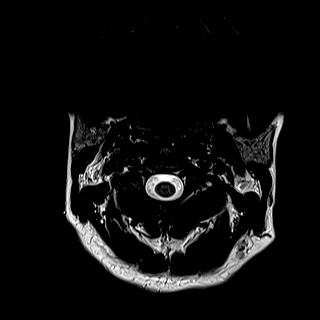

[Series 9: GRE · axial · 3.0mm · 0.35mm/px · z∈[-112,-101]mm · 2 of 30 slices shown]
[im 1/30]
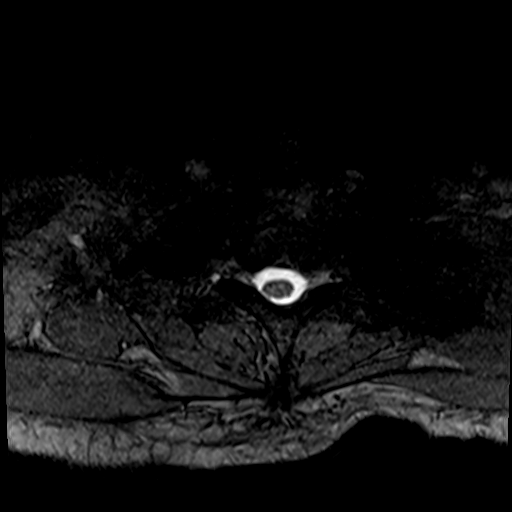
[im 4/30]
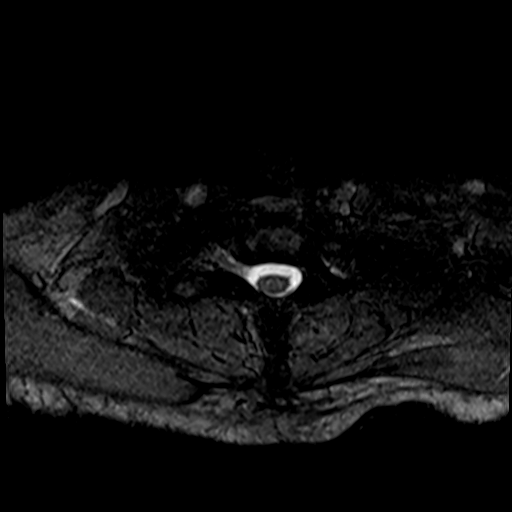

[Series 10: T1 · axial · 3.0mm · 0.35mm/px · z∈[-112,-7]mm · 8 of 30 slices shown (2 of 2)]
[im 1/30]
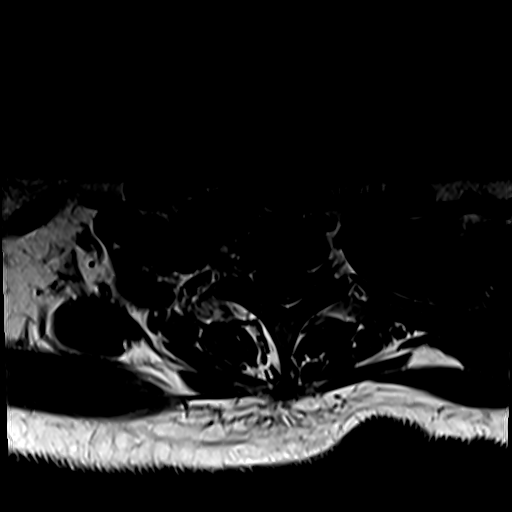
[im 4/30]
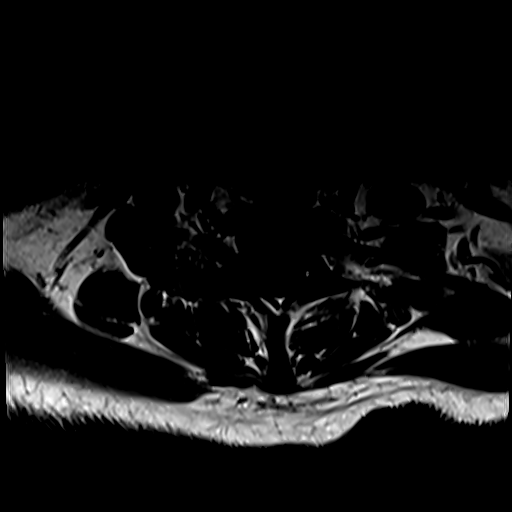
[im 10/30]
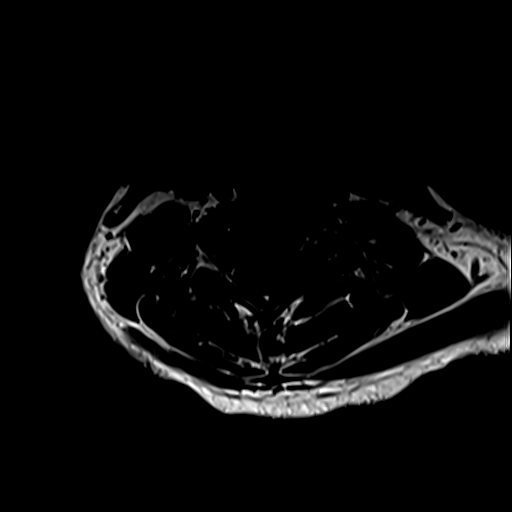
[im 13/30]
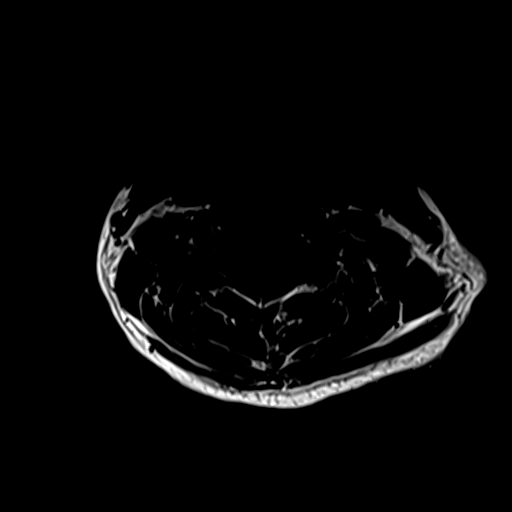
[im 17/30]
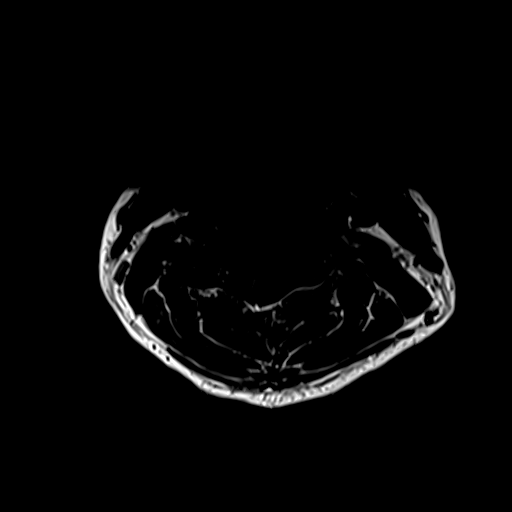
[im 20/30]
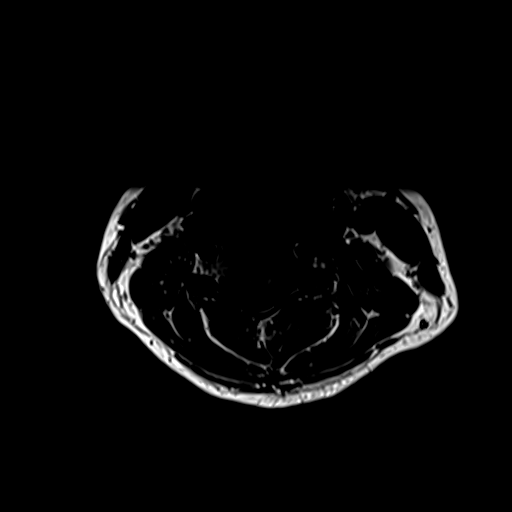
[im 26/30]
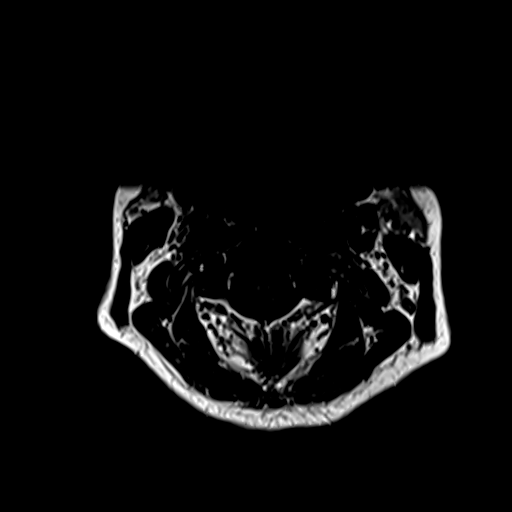
[im 30/30]
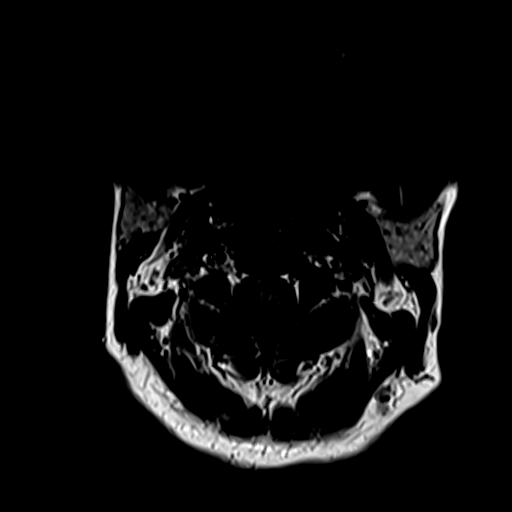

[32 of 48 positions shown; findings below may reference images not displayed]

FINDINGS: Alignment: No significant listhesis is present. Straightening of the
normal cervical lordosis is again seen. Mild rightward curvature is
centered at C4-5.

Vertebrae: Marrow signal and vertebral body heights are normal.

Cord: Normal signal and morphology.

Posterior Fossa, vertebral arteries, paraspinal tissues:
Craniocervical junction is normal. Flow is present in the vertebral
arteries bilaterally. Visualized intracranial contents are normal.

Disc levels:

C2-3: Asymmetric left-sided facet hypertrophy is present without
significant stenosis.

C3-4: Negative.

C4-5: Asymmetric left-sided uncovertebral spurring is present
without significant stenosis.

C5-6: Negative.

C6-7: A rightward soft disc extrusion results in moderate medial
right foraminal narrowing and potentially contacts the right lateral
aspect of the cord. The left foramen is patent.

C7-T1: Negative.
IMPRESSION: 1. Rightward soft disc extrusion at C6-7 potentially contacts the
right lateral aspect of the cord and contributes to moderate medial
right foraminal stenosis.
2. Asymmetric left-sided facet hypertrophy at C2-3 and C4-5 without
significant stenosis at these levels.

## 2023-07-24 ENCOUNTER — Other Ambulatory Visit: Payer: Self-pay | Admitting: Orthopaedic Surgery

## 2023-09-30 ENCOUNTER — Other Ambulatory Visit: Payer: Self-pay | Admitting: Orthopaedic Surgery

## 2023-10-01 DIAGNOSIS — F4323 Adjustment disorder with mixed anxiety and depressed mood: Secondary | ICD-10-CM | POA: Diagnosis not present

## 2023-10-22 DIAGNOSIS — F4323 Adjustment disorder with mixed anxiety and depressed mood: Secondary | ICD-10-CM | POA: Diagnosis not present

## 2023-10-31 DIAGNOSIS — F4323 Adjustment disorder with mixed anxiety and depressed mood: Secondary | ICD-10-CM | POA: Diagnosis not present

## 2023-11-04 DIAGNOSIS — F411 Generalized anxiety disorder: Secondary | ICD-10-CM | POA: Diagnosis not present

## 2023-11-04 DIAGNOSIS — F332 Major depressive disorder, recurrent severe without psychotic features: Secondary | ICD-10-CM | POA: Diagnosis not present

## 2023-11-21 DIAGNOSIS — F4323 Adjustment disorder with mixed anxiety and depressed mood: Secondary | ICD-10-CM | POA: Diagnosis not present

## 2023-11-28 DIAGNOSIS — E559 Vitamin D deficiency, unspecified: Secondary | ICD-10-CM | POA: Diagnosis not present

## 2023-11-28 DIAGNOSIS — Z0000 Encounter for general adult medical examination without abnormal findings: Secondary | ICD-10-CM | POA: Diagnosis not present

## 2023-11-28 DIAGNOSIS — R7303 Prediabetes: Secondary | ICD-10-CM | POA: Diagnosis not present

## 2023-11-28 DIAGNOSIS — F4323 Adjustment disorder with mixed anxiety and depressed mood: Secondary | ICD-10-CM | POA: Diagnosis not present

## 2023-12-05 DIAGNOSIS — F4323 Adjustment disorder with mixed anxiety and depressed mood: Secondary | ICD-10-CM | POA: Diagnosis not present

## 2023-12-06 DIAGNOSIS — Z0000 Encounter for general adult medical examination without abnormal findings: Secondary | ICD-10-CM | POA: Diagnosis not present

## 2023-12-06 DIAGNOSIS — R7303 Prediabetes: Secondary | ICD-10-CM | POA: Diagnosis not present

## 2023-12-06 DIAGNOSIS — E559 Vitamin D deficiency, unspecified: Secondary | ICD-10-CM | POA: Diagnosis not present

## 2023-12-11 ENCOUNTER — Other Ambulatory Visit: Payer: Self-pay | Admitting: Radiology

## 2023-12-11 MED ORDER — GABAPENTIN 300 MG PO CAPS: 300.0000 mg | ORAL_CAPSULE | Freq: Three times a day (TID) | ORAL | 1 refills | Status: DC

## 2023-12-12 DIAGNOSIS — F4323 Adjustment disorder with mixed anxiety and depressed mood: Secondary | ICD-10-CM | POA: Diagnosis not present

## 2023-12-19 DIAGNOSIS — F4323 Adjustment disorder with mixed anxiety and depressed mood: Secondary | ICD-10-CM | POA: Diagnosis not present

## 2023-12-26 DIAGNOSIS — F4323 Adjustment disorder with mixed anxiety and depressed mood: Secondary | ICD-10-CM | POA: Diagnosis not present

## 2024-01-01 DIAGNOSIS — F4323 Adjustment disorder with mixed anxiety and depressed mood: Secondary | ICD-10-CM | POA: Diagnosis not present

## 2024-01-08 DIAGNOSIS — F4323 Adjustment disorder with mixed anxiety and depressed mood: Secondary | ICD-10-CM | POA: Diagnosis not present

## 2024-01-14 DIAGNOSIS — F4323 Adjustment disorder with mixed anxiety and depressed mood: Secondary | ICD-10-CM | POA: Diagnosis not present

## 2024-01-16 DIAGNOSIS — F332 Major depressive disorder, recurrent severe without psychotic features: Secondary | ICD-10-CM | POA: Diagnosis not present

## 2024-01-16 DIAGNOSIS — F411 Generalized anxiety disorder: Secondary | ICD-10-CM | POA: Diagnosis not present

## 2024-01-21 DIAGNOSIS — F4323 Adjustment disorder with mixed anxiety and depressed mood: Secondary | ICD-10-CM | POA: Diagnosis not present

## 2024-01-30 DIAGNOSIS — F4323 Adjustment disorder with mixed anxiety and depressed mood: Secondary | ICD-10-CM | POA: Diagnosis not present

## 2024-02-06 DIAGNOSIS — F4323 Adjustment disorder with mixed anxiety and depressed mood: Secondary | ICD-10-CM | POA: Diagnosis not present

## 2024-02-11 ENCOUNTER — Other Ambulatory Visit: Payer: Self-pay | Admitting: Orthopedic Surgery

## 2024-02-14 DIAGNOSIS — F332 Major depressive disorder, recurrent severe without psychotic features: Secondary | ICD-10-CM | POA: Diagnosis not present

## 2024-02-14 DIAGNOSIS — F411 Generalized anxiety disorder: Secondary | ICD-10-CM | POA: Diagnosis not present

## 2024-02-20 ENCOUNTER — Other Ambulatory Visit: Payer: Self-pay | Admitting: Orthopedic Surgery

## 2024-02-20 DIAGNOSIS — F4323 Adjustment disorder with mixed anxiety and depressed mood: Secondary | ICD-10-CM | POA: Diagnosis not present

## 2024-02-27 ENCOUNTER — Other Ambulatory Visit (INDEPENDENT_AMBULATORY_CARE_PROVIDER_SITE_OTHER): Payer: Self-pay

## 2024-02-27 ENCOUNTER — Ambulatory Visit: Admitting: Physician Assistant

## 2024-02-27 ENCOUNTER — Other Ambulatory Visit: Payer: Self-pay

## 2024-02-27 ENCOUNTER — Other Ambulatory Visit: Payer: Self-pay | Admitting: Physician Assistant

## 2024-02-27 ENCOUNTER — Encounter: Payer: Self-pay | Admitting: Physician Assistant

## 2024-02-27 DIAGNOSIS — M546 Pain in thoracic spine: Secondary | ICD-10-CM

## 2024-02-27 DIAGNOSIS — M549 Dorsalgia, unspecified: Secondary | ICD-10-CM | POA: Diagnosis not present

## 2024-02-27 DIAGNOSIS — F4323 Adjustment disorder with mixed anxiety and depressed mood: Secondary | ICD-10-CM | POA: Diagnosis not present

## 2024-02-27 MED ORDER — GABAPENTIN 300 MG PO CAPS: 300.0000 mg | ORAL_CAPSULE | Freq: Three times a day (TID) | ORAL | 1 refills | Status: DC

## 2024-02-27 NOTE — Progress Notes (Signed)
 Office Visit Note   Patient: Cheryl Juarez           Date of Birth: 1980/10/20           MRN: [id number] Visit Date: 02/27/2024              Requested by: Katina Pfeiffer, PA-C [address] [place],  [place] [address] PCP: Katina Pfeiffer, PA-C   Assessment & Plan: Visit Diagnoses:  1. Pain in thoracic spine   2. Mid back pain     Plan: Cheryl Juarez is a pleasant 43 year old woman who is followed by Dr. Barbarann.  She is status post cervical arthroplasty with him and with regards that she is doing quite well.  She did visit with him prior to his retirement and complained of burning in her thoracic spine.  X-rays did show some degenerative changes at T6-T7 though no evidence of any acute issues.  He did send her for physical therapy she said she did 10 sessions of physical therapy and completed as instructed she is also been taking gabapentin .  She still continues to get the burning.  Since has been an ongoing problem and she has failed physical therapy have recommended an MRI with her thoracic spine and then a follow-up with Duwaine Pouch to see if cyst if injections might be an option.  Follow-Up Instructions: No follow-ups on file.   Orders:  Orders Placed This Encounter  Procedures   XR Thoracic Spine 2 View   No orders of the defined types were placed in this encounter.     Procedures: No procedures performed   Clinical Data: No additional findings.   Subjective: Chief Complaint  Patient presents with   Spine - Follow-up, Pain    T-SPINE    HPI patient is a 43 year old woman who presents today with a chief complaint of thoracic upper back pain.  She describes it as burning.  She has difficulty sleeping.  She did do physical and physical therapy she is SSA that yoga helps.  She would like to see what options she has at this point she has no previous injuries injections  Review of Systems  All other systems reviewed and are  negative.    Objective: Vital Signs: There were no vitals taken for this visit.  Physical Exam Constitutional:      Appearance: Normal appearance.  Pulmonary:     Effort: Pulmonary effort is normal.   Skin:    General: Skin is warm and dry.   Neurological:     General: No focal deficit present.     Mental Status: She is alert and oriented to person, place, and time.   Psychiatric:        Mood and Affect: Mood normal.        Behavior: Behavior normal.     Ortho Exam Examination of her thoracic spine she has no step-off she has some reproduction of symptoms with palpation over the mid thoracic spine.  She has no radicular findings biceps triceps intact she has good sensation no radiation to the scapula no increased pain with flexion or extension Specialty Comments:  No specialty comments available.  Imaging: No results found.   PMFS History: Patient Active Problem List   Diagnosis Date Noted   S/P cervical disc replacement 07/17/2022   Pain in thoracic spine 07/17/2022   HNP (herniated nucleus pulposus), cervical 11/13/2021   Past Medical History:  Diagnosis Date   Anemia    hx of iron-deficiency anemia, pt  states it comes and goes   Anxiety    Depression    Pre-diabetes     Family History  Problem Relation Age of Onset   Breast cancer Paternal Aunt    Breast cancer Paternal Grandmother     Past Surgical History:  Procedure Laterality Date   CERVICAL DISC ARTHROPLASTY N/A 11/13/2021   Procedure: C6-7 CERVICAL ANTERIOR DISC ARTHROPLASTY;  Surgeon: Barbarann Oneil BROCKS, MD;  Location: [facility] OR;  Service: Orthopedics;  Laterality: N/A;   EYE SURGERY Left 1987   to fix lazy eye   PATELLAR TENDON REPAIR Left 1997   THYROIDECTOMY, PARTIAL  2003   Social History   Occupational History   Not on file  Tobacco Use   Smoking status: Never   Smokeless tobacco: Never  Vaping Use   Vaping status: Never Used  Substance and Sexual Activity   Alcohol use: Yes     Comment: maybe 1 mixed drink once a month   Drug use: Never   Sexual activity: Not on file

## 2024-03-05 DIAGNOSIS — F4323 Adjustment disorder with mixed anxiety and depressed mood: Secondary | ICD-10-CM | POA: Diagnosis not present

## 2024-03-07 ENCOUNTER — Ambulatory Visit
Admission: RE | Admit: 2024-03-07 | Discharge: 2024-03-07 | Disposition: A | Discharge status: Home / Self Care | Source: Ambulatory Visit | Attending: Physician Assistant | Admitting: Physician Assistant

## 2024-03-07 DIAGNOSIS — M549 Dorsalgia, unspecified: Secondary | ICD-10-CM

## 2024-03-07 DIAGNOSIS — M546 Pain in thoracic spine: Secondary | ICD-10-CM

## 2024-03-07 DIAGNOSIS — M5124 Other intervertebral disc displacement, thoracic region: Secondary | ICD-10-CM | POA: Diagnosis not present

## 2024-03-07 DIAGNOSIS — M47814 Spondylosis without myelopathy or radiculopathy, thoracic region: Secondary | ICD-10-CM | POA: Diagnosis not present

## 2024-03-09 DIAGNOSIS — F332 Major depressive disorder, recurrent severe without psychotic features: Secondary | ICD-10-CM | POA: Diagnosis not present

## 2024-03-09 DIAGNOSIS — F411 Generalized anxiety disorder: Secondary | ICD-10-CM | POA: Diagnosis not present

## 2024-03-12 DIAGNOSIS — F4323 Adjustment disorder with mixed anxiety and depressed mood: Secondary | ICD-10-CM | POA: Diagnosis not present

## 2024-03-19 DIAGNOSIS — F4323 Adjustment disorder with mixed anxiety and depressed mood: Secondary | ICD-10-CM | POA: Diagnosis not present

## 2024-03-25 ENCOUNTER — Encounter: Payer: Self-pay | Admitting: Physical Medicine and Rehabilitation

## 2024-03-25 ENCOUNTER — Ambulatory Visit: Admitting: Physical Medicine and Rehabilitation

## 2024-03-25 DIAGNOSIS — M546 Pain in thoracic spine: Secondary | ICD-10-CM | POA: Diagnosis not present

## 2024-03-25 DIAGNOSIS — G8929 Other chronic pain: Secondary | ICD-10-CM | POA: Diagnosis not present

## 2024-03-25 DIAGNOSIS — M7918 Myalgia, other site: Secondary | ICD-10-CM | POA: Diagnosis not present

## 2024-03-25 NOTE — Progress Notes (Signed)
 Cheryl Juarez - 43 y.o. female MRN [id number]  Date of birth: October 08, 1980  Office Visit Note: Visit Date: 03/25/2024 PCP: Katina Pfeiffer, PA-C Referred by: Katina Pfeiffer, PA-C  Subjective: Chief Complaint  Patient presents with   Middle Back - Pain   HPI: [name] is a 43 y.o. female who comes in today per the request of Cheryl Dragon Persons, PA for evaluation of chronic, worsening and severe bilateral thoracic back pain. Pain ongoing for 5 plus years. Her pain worsens with lifting. She describes pain as burning sensation, currently rates as 2 out of 10. Some relief of pain with home exercise regimen, rest and use of medications. History of formal physical therapy with some relief of pain. States her pain does seem to decrease with activity and YOGA. She is currently taking Gabapentin  300 mg TID with significant relief of pain. 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 No high grade spinal canal stenosis. Patient denies focal weakness, numbness and tingling. No recent trauma or falls.       Review of Systems  Musculoskeletal:  Positive for back pain.  Neurological:  Negative for tingling, sensory change, focal weakness and weakness.  All other systems reviewed and are negative.  Otherwise per HPI.  Assessment & Plan: Visit Diagnoses:    ICD-10-CM   1. Chronic bilateral thoracic back pain  M54.6    G89.29     2. Myofascial pain syndrome  M79.18        Plan: Findings:  Chronic, worsening and severe bilateral thoracic back pain. Patient continues to have pain despite good conservative therapies such formal physical therapy, home exercise regimen, rest and use of medications. Patients clinical presentation and exam are complex. Her pain does seem to be more myofascial in nature. I discussed recent thoracic MRI with her today using imaging. Thoracic MRI imaging looks fairly normal, no  severe nerve impingement, no high grade spinal canal stenosis noted. There is early arthritic changes on the right at T2-T3, T3-T4, would recommend trying topical Voltaren gel. Would not recommend interventional spine procedures at this time as her pain does seem to be manageable with conservative therapies and medication. I instructed patient to remain active as tolerated, she can follow up with us  as needed. No red flag symptoms noted upon exam today.     Meds & Orders: No orders of the defined types were placed in this encounter.  No orders of the defined types were placed in this encounter.   Follow-up: Return if symptoms worsen or fail to improve.   Procedures: No procedures performed      Clinical History: CLINICAL DATA:  Thoracic region back pain with radiation to the shoulders. Duration of symptoms 4 years.   EXAM: MRI THORACIC SPINE WITHOUT CONTRAST   TECHNIQUE: Multiplanar, multisequence MR imaging of the thoracic spine was performed. No intravenous contrast was administered.   COMPARISON:  Radiography 02/27/2024   FINDINGS: Alignment:  No malalignment.   Vertebrae: No focal bone lesion. Mild developmental reverse wedge configuration at T10 and wedge configuration at T11. This is not significant.   Cord: No cord compression or focal cord lesion. Mild prominence of the central canal in the T11 region, not likely significant. No frank syrinx.   Paraspinal and other soft tissues: Negative   Disc levels:   T1-2: Normal   T2-3: Mild bulging of the disc. 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 No compressive stenosis.  T3-4: Normal disc. Mild facet osteoarthritis on the right. No stenosis.   T4-5 and T5-6: Normal   T6-7: Mild bulging of the disc.  No compressive stenosis.   T7-8 through T12-L1: Normal   IMPRESSION: 1. No advanced or likely symptomatic finding. Mild non-compressive disc bulges at T2-3 and T6-7. 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 2. Mild prominence of the central canal in the T11 region, not likely significant. No frank syrinx.     Electronically Signed   By: Oneil Officer M.D.   On: 03/09/2024 12:47   She reports that she has never smoked. She has never used smokeless tobacco. No results for input(s): HGBA1C, LABURIC in the last 8760 hours.  Objective:  VS:  HT:    WT:   BMI:     BP:   HR: bpm  TEMP: ( )  RESP:  Physical Exam Vitals and nursing note reviewed.  HENT:     Head: Normocephalic and atraumatic.     Right Ear: External ear normal.     Left Ear: External ear normal.     Nose: Nose normal.     Mouth/Throat:     Mouth: Mucous membranes are moist.  Eyes:     Extraocular Movements: Extraocular movements intact.  Cardiovascular:     Rate and Rhythm: Normal rate.     Pulses: Normal pulses.  Pulmonary:     Effort: Pulmonary effort is normal.  Abdominal:     General: Abdomen is flat. There is no distension.  Musculoskeletal:        General: Tenderness present.     Cervical back: Normal range of motion.     Comments: Normal thoracic kyphosis noted. No tenderness noted upon palpation of spinous processes. No step off deformity. No pain noted with flexion, extension, lateral bending and rotation. Mild myofascial tenderness noted to bilateral thoracic paraspinal regions. Good strength noted to bilateral upper extremities.    Skin:    General: Skin is warm and dry.     Capillary Refill: Capillary refill takes less than 2 seconds.  Neurological:     General: No focal deficit present.     Mental Status: She is alert and oriented to person, place, and time.  Psychiatric:        Mood and Affect: Mood normal.        Behavior: Behavior normal.     Ortho Exam  Imaging: No results found.  Past Medical/Family/Surgical/Social History: Medications & Allergies reviewed per EMR, new medications updated. Patient Active Problem List   Diagnosis Date Noted   S/P cervical disc replacement  07/17/2022   Pain in thoracic spine 07/17/2022   HNP (herniated nucleus pulposus), cervical 11/13/2021   Past Medical History:  Diagnosis Date   Anemia    hx of iron-deficiency anemia, pt states it comes and goes   Anxiety    Depression    Pre-diabetes    Family History  Problem Relation Age of Onset   Breast cancer Paternal Aunt    Breast cancer Paternal Grandmother    Past Surgical History:  Procedure Laterality Date   CERVICAL DISC ARTHROPLASTY N/A 11/13/2021   Procedure: C6-7 CERVICAL ANTERIOR DISC ARTHROPLASTY;  Surgeon: Barbarann Oneil BROCKS, MD;  Location: [facility] OR;  Service: Orthopedics;  Laterality: N/A;   EYE SURGERY Left 1987   to fix lazy eye   PATELLAR TENDON REPAIR Left 1997   THYROIDECTOMY, PARTIAL  2003   Social History   Occupational History  Not on file  Tobacco Use   Smoking status: Never   Smokeless tobacco: Never  Vaping Use   Vaping status: Never Used  Substance and Sexual Activity   Alcohol use: Yes    Comment: maybe 1 mixed drink once a month   Drug use: Never   Sexual activity: Not on file

## 2024-03-25 NOTE — Progress Notes (Signed)
 Pain Scale   Average Pain 1 Patient advising she has middle back pain that increases when she is lifting luggage or moving something heavy. Patient did advise she gets some relief when she is lying down        +Driver, -BT, -Dye Allergies.

## 2024-04-07 DIAGNOSIS — F332 Major depressive disorder, recurrent severe without psychotic features: Secondary | ICD-10-CM | POA: Diagnosis not present

## 2024-04-07 DIAGNOSIS — F411 Generalized anxiety disorder: Secondary | ICD-10-CM | POA: Diagnosis not present

## 2024-04-08 DIAGNOSIS — F4323 Adjustment disorder with mixed anxiety and depressed mood: Secondary | ICD-10-CM | POA: Diagnosis not present

## 2024-04-14 DIAGNOSIS — F4323 Adjustment disorder with mixed anxiety and depressed mood: Secondary | ICD-10-CM | POA: Diagnosis not present

## 2024-04-23 DIAGNOSIS — F4323 Adjustment disorder with mixed anxiety and depressed mood: Secondary | ICD-10-CM | POA: Diagnosis not present

## 2024-04-26 ENCOUNTER — Other Ambulatory Visit: Payer: Self-pay | Admitting: Physician Assistant

## 2024-04-28 DIAGNOSIS — F4323 Adjustment disorder with mixed anxiety and depressed mood: Secondary | ICD-10-CM | POA: Diagnosis not present

## 2024-05-07 DIAGNOSIS — F411 Generalized anxiety disorder: Secondary | ICD-10-CM | POA: Diagnosis not present

## 2024-05-07 DIAGNOSIS — F332 Major depressive disorder, recurrent severe without psychotic features: Secondary | ICD-10-CM | POA: Diagnosis not present

## 2024-05-07 DIAGNOSIS — F4323 Adjustment disorder with mixed anxiety and depressed mood: Secondary | ICD-10-CM | POA: Diagnosis not present

## 2024-05-19 DIAGNOSIS — U071 COVID-19: Secondary | ICD-10-CM | POA: Diagnosis not present

## 2024-06-03 DIAGNOSIS — F4323 Adjustment disorder with mixed anxiety and depressed mood: Secondary | ICD-10-CM | POA: Diagnosis not present

## 2024-06-05 DIAGNOSIS — F411 Generalized anxiety disorder: Secondary | ICD-10-CM | POA: Diagnosis not present

## 2024-06-05 DIAGNOSIS — F332 Major depressive disorder, recurrent severe without psychotic features: Secondary | ICD-10-CM | POA: Diagnosis not present

## 2024-06-29 DIAGNOSIS — F411 Generalized anxiety disorder: Secondary | ICD-10-CM | POA: Diagnosis not present

## 2024-06-29 DIAGNOSIS — F332 Major depressive disorder, recurrent severe without psychotic features: Secondary | ICD-10-CM | POA: Diagnosis not present

## 2024-07-06 ENCOUNTER — Encounter: Payer: Self-pay | Admitting: Radiology

## 2024-07-12 ENCOUNTER — Other Ambulatory Visit: Payer: Self-pay | Admitting: Physical Medicine and Rehabilitation

## 2024-07-14 DIAGNOSIS — F4323 Adjustment disorder with mixed anxiety and depressed mood: Secondary | ICD-10-CM | POA: Diagnosis not present

## 2024-07-28 DIAGNOSIS — F332 Major depressive disorder, recurrent severe without psychotic features: Secondary | ICD-10-CM | POA: Diagnosis not present

## 2024-07-28 DIAGNOSIS — F411 Generalized anxiety disorder: Secondary | ICD-10-CM | POA: Diagnosis not present

## 2024-08-04 DIAGNOSIS — F4323 Adjustment disorder with mixed anxiety and depressed mood: Secondary | ICD-10-CM | POA: Diagnosis not present

## 2024-09-06 ENCOUNTER — Other Ambulatory Visit: Payer: Self-pay | Admitting: Physical Medicine and Rehabilitation
